# Patient Record
Sex: Male | Born: 1955 | Race: White | Hispanic: No | Marital: Single | Smoking: Former smoker
Health system: Southern US, Community
[De-identification: ages and names within clinical notes are randomized; demographics above are authoritative.]

## PROBLEM LIST (undated history)

## (undated) DIAGNOSIS — IMO0002 Reserved for concepts with insufficient information to code with codable children: Secondary | ICD-10-CM

## (undated) DIAGNOSIS — J449 Chronic obstructive pulmonary disease, unspecified: Secondary | ICD-10-CM

## (undated) DIAGNOSIS — R319 Hematuria, unspecified: Secondary | ICD-10-CM

## (undated) HISTORY — PX: ABDOMINAL SURGERY: SHX537

## (undated) HISTORY — DX: Hematuria, unspecified: R31.9

## (undated) HISTORY — PX: INNER EAR SURGERY: SHX679

## (undated) HISTORY — PX: OTHER SURGICAL HISTORY: SHX169

---

## 2005-04-13 ENCOUNTER — Other Ambulatory Visit: Payer: Self-pay

## 2005-04-13 ENCOUNTER — Emergency Department: Payer: Self-pay | Admitting: Emergency Medicine

## 2006-05-19 ENCOUNTER — Ambulatory Visit: Payer: Self-pay | Admitting: Sports Medicine

## 2006-05-19 ENCOUNTER — Inpatient Hospital Stay (HOSPITAL_COMMUNITY): Admission: EM | Admit: 2006-05-19 | Discharge: 2006-05-23 | Payer: Self-pay | Admitting: Emergency Medicine

## 2007-10-23 ENCOUNTER — Inpatient Hospital Stay (HOSPITAL_COMMUNITY): Admission: EM | Admit: 2007-10-23 | Discharge: 2007-10-25 | Payer: Self-pay | Admitting: Emergency Medicine

## 2010-09-29 ENCOUNTER — Inpatient Hospital Stay: Payer: Self-pay | Admitting: Internal Medicine

## 2010-12-19 NOTE — H&P (Signed)
NAMEFadi, [name]                  ACCOUNT NO.:  [id number]   MEDICAL RECORD NO.:  [id number]          PATIENT TYPE:  EMS   LOCATION:  MAJO                         FACILITY:  [facility]   PHYSICIAN:  Lucita Ferrara, MD         DATE OF BIRTH:  06/09/1956   DATE OF ADMISSION:  10/22/2007  DATE OF DISCHARGE:                              HISTORY & PHYSICAL   CHIEF COMPLAINT:  I vomited blood.   HISTORY OF PRESENT ILLNESS:  The patient is a 55 year old Caucasian male  presents to [facility] with vomiting coffee ground emesis  times one.  Symptoms began 3 days ago.  This was also accompanied by a  black stools that been ongoing.  In addition to this, patient has been  feeling dizzy.  This is accompanied by abdominal epigastric tenderness,  yet his abdomen is not distended.  The patient also had a massive  headache earlier in the day which caused him to take two ibuprofen back-  to-back which made his coffee-ground emesis and his bloody bowel  movements worse. He denies any massive hemoptysis or hematochezia or  bright red blood per rectum.  He also denies chest pain, shortness of  breath, orthopnea, paroxysmal nocturnal dyspnea.  He states that he does  get dizzy upon ambulation or upon standing up.  The patient is a known  alcoholic, last drink was earlier in the day.  The patient underwent a  prior EGD in May 23, 2006, which showed a normal-appearing  esophagus, but diffuse hemorrhagic gastritis with large amount of blood  in the stomach.  High-dose PPIs were recommended and avoidance of all  nonsteroidal anti-inflammatory medicines.   PAST MEDICAL HISTORY:  1. Peptic ulcer disease about 11 years ago.  2. Heavy alcohol intake.   FAMILY HISTORY:  Mother is living age 52 with coronary artery disease,  gallstones and also alcoholism.  Father died of leukemia at 30.  Brother  is living with diabetes.   ALLERGIES:  NO KNOWN DRUG ALLERGIES.   MEDICATIONS:  The patient denies  any other medications but p.r.n.  ibuprofen.   ALLERGIES:  NO KNOWN DRUG ALLERGIES   PHYSICAL EXAMINATION:  VITAL SIGNS:  Blood pressure is 121/76 pulse 94,  respirations 19, pulse ox 94% on room air.  The patient looks  uncomfortable, anxious.  HEENT: Mucous membranes are dry.  Head is  normocephalic, atraumatic.  Sclerae anicteric.  NECK:  Supple.  No JVD, no carotid bruits.  PERLA.  Extraocular muscles  intact.  CARDIOVASCULAR:  S1-S2.  Regular rate and rhythm.  No murmurs, rubs or  clicks.  ABDOMEN: Soft but there is epigastric tenderness to light palpation.  The abdomen is nondistended.  There is no hepatosplenomegaly.  EXTREMITIES: There is no clubbing, cyanosis or edema.  LUNGS:  Clear to auscultation bilaterally.  No rhonchi, rales or  wheezes.  NEUROLOGICAL:  Patient is alert and oriented x3.   LABORATORY DATA:  Blood alcohol level 353, amylase 99, sodium 136,  potassium 3.5, chloride 102, CO2 25, BUN 6, creatinine 0.58, AST/ALT 40  and 24, total bili 0.5, hemoglobin 14.5, hematocrit 41.8, platelets 200.   ASSESSMENT/PLAN:  The patient is a 55 year old with:  1. Coffee-ground emesis and dark stools with a normal hemoglobin. Note      that the patient may most likely have NSAID induced peptic ulcer      disease. r/o verecial bleed.  Will go ahead and admit the patient      to telemetry floor, monitor hemodynamic status and blood pressure      hemoglobin/hematocrit every 8 hours, type and cross 2 units and      hold, hydrate for now.  Check lipase.  May consider GI consultation      for upper and lower endoscopy will start Protonix 40 mg every 12      hours.  Consider Carafate.  2. Alcohol abuse/intoxication.  Thiamine, folate, multivitamin.      Ativan withdrawal protocol will be initiated.  Fall precautions.      Lucita Ferrara, MD  Electronically Signed     RR/MEDQ  D:  10/23/2007  T:  10/23/2007  Job:  [id number]

## 2010-12-19 NOTE — Consult Note (Signed)
NAMEDemetrias, [name]                  ACCOUNT NO.:  [id number]   MEDICAL RECORD NO.:  [id number]          PATIENT TYPE:  INP   LOCATION:  5525                         FACILITY:  [facility]   PHYSICIAN:  Jordan Hawks. Elnoria Howard, MD    DATE OF BIRTH:  1955-08-28   DATE OF CONSULTATION:  10/23/2007  DATE OF DISCHARGE:                                 CONSULTATION   REASON FOR CONSULTATION:  Hematemesis.   HISTORY OF PRESENT ILLNESS:  This is a 55 year old gentleman with a past  medical history of alcohol abuse, peptic ulcer disease status post  antrectomy for bleeding blood vessel, and hemorrhagic gastritis in  October 2007 who is admitted to the hospital with complaints of weakness  and coffee-ground emesis.  During this interview, the patient is  providing an inconsistent history with the one that was initially  obtained by [facility] hospitalists.  Apparently, at this time he states  having some hematemesis that followed a bout of nausea and vomiting.  Although, he states that he had hematochezia that preceded his  hematemesis.  He does give a history also of having melena.  The patient  does have some abdominal pain that is intermittent, and he does report  using ibuprofen.  At the time of admission, the patient was noted to be  markedly intoxicated and his hemoglobin value was 14.  Over the course  of his hospitalization, his hemoglobin has remained stable, and at this  time he reports having one episode of vomiting which was read.  However,  he just ate strawberry Jello   PAST MEDICAL AND SURGICAL HISTORY:  As stated above.   FAMILY HISTORY:  Noncontributory.   SOCIAL HISTORY:  Positive for alcohol abuse.   ALLERGIES:  NO KNOWN DRUG ALLERGIES.   HOME MEDICATIONS:  Ibuprofen p.r.n.   REVIEW OF SYSTEMS:  Unable to accurately obtain from the patient at this  time.   PHYSICAL EXAMINATION:  VITAL SIGNS:  Blood pressure is 127/82, heart  rate is 100, temperature is 98.2.  GENERAL:  The patient  is in no acute distress, alert and oriented.  HEENT:  Normocephalic, atraumatic.  Extraocular muscles intact.  Neck is  supple.  No lymphadenopathy.  LUNGS:  Clear to auscultation bilaterally.  CARDIOVASCULAR:  Regular rate and rhythm.  ABDOMEN:  Flat, soft, nontender, nondistended.  There is a well-healed  midline incisional scar.  Positive bowel sounds.  EXTREMITIES:  No clubbing, cyanosis or edema.  RECTAL:  Examination is negative for any palpable masses, and there is  brown stool which is heme-negative.   LABORATORY VALUES:  The current hemoglobin is 13.6 on admission.  White  blood cell count 8.2, hemoglobin 14.5, MCV is 101.7, platelets at 200.  PT is 11.3, INR 0.8, PTT is 25.  Sodium 139, potassium 3.5, chloride  103, CO2 25, glucose 113, BUN 4, creatinine 0.5, total bilirubin 0.6,  alk phos 45, AST 45, ALT 27, albumin 3.4.  Serum alcohol level on  admission is 353.   IMPRESSION:  1. Alcohol abuse.  2. Possible Mallory Weiss tear.  3. Heme negative.  4. History of peptic ulcer disease.  5. History of hemorrhagic gastritis.   Given the current findings and my examination of the patient,  specifically the rectal examination, I doubt that he has a significant  GI bleed.  Hemoglobin has remained stable and there is no decline at  this time.  The patient was very inconsistent with his history in  regards to what he saw, for example hematochezia versus melena, and also  hemoptysis versus coffee-ground emesis.  He certainly had a significant  GI bleed in 2007, but at this time, I do not believe that is the case.   PLAN:  1. Follow his hemoglobin.  2. If there is a marked drop in his hemoglobin upon the next check,      then an EGD can be performed.  3. I have advised the patient to stop drinking alcohol.      Jordan Hawks Elnoria Howard, MD  Electronically Signed     PDH/MEDQ  D:  10/23/2007  T:  10/23/2007  Job:  [id number]

## 2010-12-22 NOTE — H&P (Signed)
NAMECopeland, [name]                  ACCOUNT NO.:  [id number]   MEDICAL RECORD NO.:  [id number]          PATIENT TYPE:  INP   LOCATION:  1828                         FACILITY:  [facility]   PHYSICIAN:  Ruthe Mannan, M.D.       DATE OF BIRTH:  1956/03/15   DATE OF ADMISSION:  05/19/2006  DATE OF DISCHARGE:                                HISTORY & PHYSICAL   CHIEF COMPLAINT:  Vomiting blood and dark stools.   HISTORY OF PRESENT ILLNESS:  [name] is a 55 year old male with a history  of GI bleed and alcoholism who presents to the emergency department for one  day history of spitting up blood and dark stools.  He reports that these  symptoms started this morning along with left lower quadrant crampy  abdominal pain.  The pain is not relieved or aggravated by anything.  He  also reports his stools were loose and dark since this morning.  He also  endorsed dyspnea and decreased appetite.  He denies fever and weight loss.   PAST MEDICAL HISTORY:  Peptic ulcer disease that required surgery 10 years  ago.   FAMILY HISTORY:  Mother is living .  she is 52 years old with CAD,  gallstones, alcoholism with a history of GI bleed, hypertension, and  hypothyroidism.  Father died of leukemia at 73 years of age.  Brother living  with diabetes.   ALLERGIES:  No known drug allergies.   MEDICATIONS:  None.   SOCIAL HISTORY:  [place]  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  He also drinks 1-  1/2 pints of vodka per week and 6-12 beers per day.  He denies any drug use.   REVIEW OF SYSTEMS:  GENERAL:  No fevers, no weight loss.  CVS:  Positive  racing heartbeat.  Positive dizziness.  RESPIRATORY:  No shortness of  breath.  GI:  Positive for melena, positive for blood-tinged sputum.   PHYSICAL EXAMINATION:  VITAL SIGNS:  Temperature 98.3, respiratory rate 18,  heart rate 136, blood pressure 113/78.  Oxygen saturation 98% on room air.  GENERAL:  He is alert, in no acute distress.  RESPIRATORY:  Clear to auscultation bilaterally.  CARDIOVASCULAR:  Regular rate and rhythm but is tachycardic.  ABDOMEN:  Soft, tender to palpation over left lower quadrant with positive  bowel sounds.  EXTREMITIES:  No edema.  He does have a scaly and scabbing 3 x 1 cm lesion  over his left shin.  He also has 2+ pulses bilaterally.  RECTAL:  Some stool in the vault.  No visible hemorrhoids or fissures.   LABORATORY DATA:  White blood cell count 12.9, hematocrit 12.2, hemoglobin  38.3, platelets 243 with 70% neutrophils and 16% lymphocytes.  Sodium 137,  potassium 4.0, chloride 101, CO2 28, BUN 26, creatinine 0.6, glucose 122,  AST 55, ALT 54.  He is still hemoccult positive in the emergency department.   ASSESSMENT/PLAN:  1. GI bleed:  Likely upper GI bleed due to past medical  history of upper      GI bleed and history of alcoholism, along with current symptoms and      hemoccult positive dark stools.  Likely not due to esophageal varices      since the patient is hemodynamically stable with a hemoglobin of 13.2.      Will consider peptic ulcer disease versus gastritis.  Will check KUB to      rule out other sources of bleeding and abdominal pain.  Will also check      lipase and amylase to rule out pancreatitis due to history of      alcoholism.  The patient is also tachycardic, likely due to volume      depletion.  We will check orthostatics and will start IV fluids of D5      half normal saline with 20 mEq of K at 125 mL per hour.  We will also      keep the patient n.p.o. for now since he may eventually require surgery      or an NG tube.  2. Leukocytosis:  Since the patient is afebrile with no obvious source of      infection, leukocytosis may likely be due to dehydration.  We will      rehydrate with IV fluids overnight and recheck a CBC in the a.m.      Consider CBC, UA, urine culture, blood culture if white blood cells      still  increased.  3. Tachycardia:  Likely secondary to dehydration.  Will check EKG now.      Will also check BMP to look for any electrolyte abnormalities.  He also      has a family history of CD.  We will recheck EKG and fasting lipid      panel in a.m.  4. Alcoholism:  Will check alcohol level and urine drug screen now.  We      will also start Ativan protocol since the patient reports last alcohol      usage was yesterday morning.  5. Tobacco abuse:  We will give a nicotine transdermal patch 21 mg daily.      We will consider tobacco cessation counseling during hospitalization.  6. Prophylaxis:  Protonix 40 mg IV b.i.d., and we will encourage      ambulation.           ______________________________  Ruthe Mannan, M.D.     TA/MEDQ  D:  05/20/2006  T:  05/20/2006  Job:  [id number]

## 2010-12-22 NOTE — Op Note (Signed)
NAMEAmiere, [name]                  ACCOUNT NO.:  [id number]   MEDICAL RECORD NO.:  [id number]          PATIENT TYPE:  INP   LOCATION:  5703                         FACILITY:  [facility]   PHYSICIAN:  Anselmo Rod, M.D.  DATE OF BIRTH:  1956/07/26   DATE OF PROCEDURE:  05/20/2006  DATE OF DISCHARGE:                                 OPERATIVE REPORT   PROCEDURE PERFORMED:  Esophagogastroduodenoscopy.   ENDOSCOPIST:  Anselmo Rod, M.D.   INSTRUMENT USED:  Olympus video panendoscope.   INDICATION FOR PROCEDURE:  A 55 year old white male with a history of  hematemesis, undergoing EGD to rule out peptic ulcer disease, esophagitis,  gastritis, etc.  The patient has a longstanding history of alcohol abuse,  question varices.   PREPROCEDURE PREPARATION:  Informed consent was procured from the patient.  The patient was fasted for 8 hours prior to the procedure.  The risks and  benefits of the procedure were discussed with him in great detail.   PREPROCEDURE PHYSICAL:  VITAL SIGNS:  The patient had stable vital signs.  NECK:  Supple.  CHEST:  Clear to auscultation.  S1, S2 regular.  ABDOMEN:  Soft with normal bowel sounds.   DESCRIPTION OF PROCEDURE:  The patient was placed in the left lateral  decubitus position and sedated with 70 mcg of fentanyl and 8 mg of Versed in  slow incremental doses.  Once the patient was adequately sedate and  maintained on low-flow oxygen and continuous cardiac monitoring, the Olympus  video panendoscope was advanced through the mouthpiece, over the tongue,  into the esophagus under direct vision.  The entire esophagus was widely  patent with no evidence of ring, stricture, masses, esophagitis or Barrett's  mucosa.  There was no evidence of varices.  The scope was then advanced into  the stomach.  Diffuse hemorrhagic gastritis was noted.  No frank ulcer,  erosion, masses, or polyp was identified.  The patient was very combative  during the procedure, and  therefore complete visualization of the proximal  small bowel was not possible.  In spite of trying to hold the patient down  to complete the procedure, the procedure had to be aborted.  No biopsies  were done.  A small hiatal hernia was seen on high retroflexion.  The  patient had evidence of a partial gastrectomy, old blood was noted in the  stomach, and a fresh blood clot was seen in the proximal small bowel.   IMPRESSION:  1. Normal-appearing esophagus.  2. Diffuse hemorrhagic gastritis with a large amount of blood in the      stomach.  3. The patient is status post distal antrectomy from ulcer disease in the      past.  4. Old and fresh blood seen in the stomach and proximal small bowel,      respectively.   RECOMMENDATIONS:  1. Will continue high-dose PPIs.  2. Avoid all nonsteroidals for now.  3. Repeat EGD under general anesthesia if the patient shows continued sign      of blood loss.      Jyothi  Elsie Amis, M.D.  Electronically Signed     JNM/MEDQ  D:  05/21/2006  T:  05/23/2006  Job:  [id number]   cc:   Melina Fiddler, MD

## 2010-12-22 NOTE — Discharge Summary (Signed)
NAMEFadil, Macmaster Nichlos                  ACCOUNT NO.:  [id number]   MEDICAL RECORD NO.:  [id number]          PATIENT TYPE:  INP   LOCATION:  5703                         FACILITY:  [facility]   PHYSICIAN:  Melina Fiddler, MD DATE OF BIRTH:  05-20-1956   DATE OF ADMISSION:  05/19/2006  DATE OF DISCHARGE:  05/23/2006                                 DISCHARGE SUMMARY   ADDENDUM   CONSULTS:  Dr. Zenaida Niece with [facility] was consulted and  followed the patient throughout his hospital stay and also performed his  upper endoscopy.     ______________________________  Drue Dun, M.D.    ______________________________  Melina Fiddler, MD    EE/MEDQ  D:  05/23/2006  T:  05/24/2006  Job:  [id number]   cc:   Dr. Zenaida Niece

## 2010-12-22 NOTE — Discharge Summary (Signed)
[name], [name]                  ACCOUNT NO.:  [id number]   MEDICAL RECORD NO.:  [id number]          PATIENT TYPE:  INP   LOCATION:  5703                         FACILITY:  [facility]   PHYSICIAN:  Melina Fiddler, MD DATE OF BIRTH:  Oct 13, 1955   DATE OF ADMISSION:  05/19/2006  DATE OF DISCHARGE:  05/23/2006                                 DISCHARGE SUMMARY   ADMISSION DIAGNOSES:  1. Hematemesis.  2. Melanotic stools.  3. Alcohol abuse, with last drink being on May 18, 2006.  4. History of peptic ulcer disease with gastrointestinal bleed requiring      surgery approximately 10 years ago.   DISCHARGE DIAGNOSES:  1. Upper gastrointestinal bleed secondary to hemorrhagic gastritis.  2. Alcohol abuse.  3. History of peptic ulcer disease with gastrointestinal bleed requiring      surgery approximately 10 years ago.   BRIEF HOSPITAL COURSE:  Please see full dictated history and physical for  more details.  In brief, this is a 55 year old white male who presented to  the emergency department with a 1-day history of hematemesis and melanotic  stools, and a known history of alcohol abuse.  The patient was admitted, and  GI was consulted for upper endoscopy.  Upper endoscopy was difficult to  perform due to patient's difficulty being sedated; however, the esophagus  was visualized as normal, and hemorrhagic gastritis was visualized on upper  endoscopy.  The patient was placed on high-dose proton pump inhibitor with  b.i.d. dosing.  Hemoglobins were checked serially to ensure no further  bleeding.  The patient was also placed on Ativan protocol for alcohol  withdrawal.  He was also given a multivitamin and thiamine given his alcohol  abuse, as well as a nicotine patch.  Gastroenterology agreed that if  patient's hemoglobin and hematocrit did not remain stable, they would take  him to the operating room for repeat upper endoscopy under general  anesthesia; however, this was not  necessary, as the patient's hemoglobin did  remain stable.  The patient will be discharged on high-dose proton pump  inhibitor and instructed to avoid NSAIDs and alcohol.   PROCEDURES:  1. EKG on May 19, 2006, and May 20, 2006, both showed no signs of      acute ischemia and were positive only for sinus tachycardia.  2. Upper endoscopy on May 20, 2006, performed by Dr. Loreta Ave, revealed      normal-appearing esophagus, but diffuse hemorrhagic gastritis with a      large amount of blood in the stomach.  The patient is status post      distal antrectomy from ulcer disease in the past.  Old and fresh blood      was visualized in the stomach and proximal bowel, respectively.   PERTINENT LABORATORY DATA:  On admission, the patient had a hemoglobin of  13.2 and hematocrit of 38.3.  These were likely somewhat hemoconcentrated  given patient's dehydration and tachycardia.  The patient's hemoglobin and  hematocrit dropped to a nadir of 9.0 and 25.3 during admission and remained  fairly stable, being 9.1  and 26.1 at the time of discharge.  The patient had  cardiac enzymes, which were negative x3.  The patient had a fasting lipid  panel, which showed a total cholesterol of 130, triglycerides of 86, HDL of  53, and LDL of 60.  Iron studies revealed a folate of 955, reticulocyte  count of 1.5%, and vitamin B12 of 472.   DISCHARGE MEDICATIONS:  1. Omeprazole 40 mg b.i.d., prescription given for 1 month's supply.  2. Multivitamin 1 tab p.o. daily.   DISCHARGE INSTRUCTIONS:  The patient is to follow a regular diet with no  activity restrictions.  The patient was instructed not to drink any alcohol  and not to take any products containing ASPIRIN, IBUPROFEN, NAPROXEN or  other NSAIDS, including GOODY'S and BC POWDER.  The patient was also  instructed to return if his bleeding recurs.   PENDING RESULTS AND ISSUES TO BE FOLLOWED AT THE TIME OF DISCHARGE:  1. The patient will need to continue  high-dose proton pump inhibitor with      b.i.d. dosing for 1 month after discharge at the recommendation of the      gastroenterologist.  After this time, patient may decrease to once-      daily dosing.  2. Would recommend a repeat hemoglobin and hematocrit as an outpatient to      ensure continued stabilization.   The patient was discharged home in stable condition.   FOLLOWUP APPOINTMENTS:  The patient has a followup appointment with Dr.  Mayford Knife at [facility] in the Hospital Followup Clinic on  May 30, 2006, at 9:00 in the morning.     ______________________________  Drue Dun, M.D.    ______________________________  Melina Fiddler, MD    EE/MEDQ  D:  05/23/2006  T:  05/24/2006  Job:  [id number]

## 2011-01-31 ENCOUNTER — Emergency Department: Payer: Self-pay | Admitting: Unknown Physician Specialty

## 2011-02-01 ENCOUNTER — Emergency Department (HOSPITAL_COMMUNITY)
Admission: EM | Admit: 2011-02-01 | Discharge: 2011-02-01 | Disposition: A | Discharge status: Home / Self Care | Payer: Self-pay | Attending: Emergency Medicine | Admitting: Emergency Medicine

## 2011-02-01 DIAGNOSIS — Z23 Encounter for immunization: Secondary | ICD-10-CM | POA: Insufficient documentation

## 2011-02-01 DIAGNOSIS — Z203 Contact with and (suspected) exposure to rabies: Secondary | ICD-10-CM | POA: Insufficient documentation

## 2011-02-01 DIAGNOSIS — F17200 Nicotine dependence, unspecified, uncomplicated: Secondary | ICD-10-CM | POA: Insufficient documentation

## 2011-02-03 ENCOUNTER — Emergency Department (HOSPITAL_COMMUNITY)
Admission: EM | Admit: 2011-02-03 | Discharge: 2011-02-03 | Disposition: A | Discharge status: Home / Self Care | Payer: Self-pay | Attending: Emergency Medicine | Admitting: Emergency Medicine

## 2011-02-03 DIAGNOSIS — L509 Urticaria, unspecified: Secondary | ICD-10-CM | POA: Insufficient documentation

## 2011-02-03 DIAGNOSIS — R110 Nausea: Secondary | ICD-10-CM | POA: Insufficient documentation

## 2011-02-03 DIAGNOSIS — R21 Rash and other nonspecific skin eruption: Secondary | ICD-10-CM | POA: Insufficient documentation

## 2011-02-03 DIAGNOSIS — R112 Nausea with vomiting, unspecified: Secondary | ICD-10-CM | POA: Insufficient documentation

## 2011-02-03 DIAGNOSIS — L2989 Other pruritus: Secondary | ICD-10-CM | POA: Insufficient documentation

## 2011-02-03 DIAGNOSIS — T50B95A Adverse effect of other viral vaccines, initial encounter: Secondary | ICD-10-CM | POA: Insufficient documentation

## 2011-02-03 DIAGNOSIS — L298 Other pruritus: Secondary | ICD-10-CM | POA: Insufficient documentation

## 2011-02-04 ENCOUNTER — Inpatient Hospital Stay (INDEPENDENT_AMBULATORY_CARE_PROVIDER_SITE_OTHER)
Admission: RE | Admit: 2011-02-04 | Discharge: 2011-02-04 | Disposition: A | Discharge status: Home / Self Care | Payer: Self-pay | Source: Ambulatory Visit | Attending: Emergency Medicine | Admitting: Emergency Medicine

## 2011-02-04 DIAGNOSIS — Z23 Encounter for immunization: Secondary | ICD-10-CM

## 2011-02-08 ENCOUNTER — Inpatient Hospital Stay (INDEPENDENT_AMBULATORY_CARE_PROVIDER_SITE_OTHER)
Admission: RE | Admit: 2011-02-08 | Discharge: 2011-02-08 | Disposition: A | Discharge status: Home / Self Care | Payer: Self-pay | Source: Ambulatory Visit | Attending: Family Medicine | Admitting: Family Medicine

## 2011-02-08 DIAGNOSIS — Z23 Encounter for immunization: Secondary | ICD-10-CM

## 2011-02-15 ENCOUNTER — Inpatient Hospital Stay (INDEPENDENT_AMBULATORY_CARE_PROVIDER_SITE_OTHER)
Admission: RE | Admit: 2011-02-15 | Discharge: 2011-02-15 | Disposition: A | Discharge status: Home / Self Care | Payer: Self-pay | Source: Ambulatory Visit | Attending: Family Medicine | Admitting: Family Medicine

## 2011-02-15 DIAGNOSIS — Z23 Encounter for immunization: Secondary | ICD-10-CM

## 2011-03-28 ENCOUNTER — Emergency Department: Payer: Self-pay | Admitting: *Deleted

## 2011-03-28 ENCOUNTER — Emergency Department (HOSPITAL_COMMUNITY)
Admission: EM | Admit: 2011-03-28 | Discharge: 2011-03-28 | Discharge status: Against Medical Advice | Payer: Self-pay | Attending: Emergency Medicine | Admitting: Emergency Medicine

## 2011-03-28 DIAGNOSIS — R079 Chest pain, unspecified: Secondary | ICD-10-CM | POA: Insufficient documentation

## 2011-04-30 LAB — CROSSMATCH: ABO/RH(D): O POS

## 2011-04-30 LAB — CBC
HCT: 41.8
HCT: 41.8
Hemoglobin: 14.3
MCHC: 34.6
MCHC: 34.7
MCV: 102.2 — ABNORMAL HIGH
MCV: 102.5 — ABNORMAL HIGH
Platelets: 200
RBC: 4.04 — ABNORMAL LOW
RBC: 4.08 — ABNORMAL LOW
RDW: 13.4

## 2011-04-30 LAB — SAMPLE TO BLOOD BANK

## 2011-04-30 LAB — COMPREHENSIVE METABOLIC PANEL
AST: 40 — ABNORMAL HIGH
AST: 60 — ABNORMAL HIGH
Albumin: 3.6
Alkaline Phosphatase: 45
Alkaline Phosphatase: 52
BUN: 4 — ABNORMAL LOW
BUN: 4 — ABNORMAL LOW
BUN: 6
CO2: 24
Calcium: 8.9
Chloride: 103
Creatinine, Ser: 0.54
GFR calc Af Amer: >60
GFR calc Af Amer: >60
GFR calc non Af Amer: >60
Glucose, Bld: 113 — ABNORMAL HIGH
Potassium: 3.5
Potassium: 3.5
Sodium: 136
Total Bilirubin: 0.6
Total Protein: 6.6

## 2011-04-30 LAB — HEMOGLOBIN AND HEMATOCRIT, BLOOD: Hemoglobin: 13.6

## 2011-04-30 LAB — BASIC METABOLIC PANEL
CO2: 25
Calcium: 8.9
Chloride: 105
GFR calc Af Amer: >60
Sodium: 139

## 2011-04-30 LAB — MAGNESIUM: Magnesium: 1.8

## 2011-04-30 LAB — PHOSPHORUS: Phosphorus: 3.6

## 2011-04-30 LAB — PROTIME-INR: INR: 0.8

## 2011-04-30 LAB — ETHANOL: Alcohol, Ethyl (B): 353 — ABNORMAL HIGH

## 2011-04-30 LAB — AMYLASE: Amylase: 99

## 2015-02-23 ENCOUNTER — Emergency Department
Admission: EM | Admit: 2015-02-23 | Discharge: 2015-02-23 | Disposition: A | Discharge status: Home / Self Care | Payer: Self-pay | Attending: Emergency Medicine | Admitting: Emergency Medicine

## 2015-02-23 ENCOUNTER — Encounter: Payer: Self-pay | Admitting: Urgent Care

## 2015-02-23 DIAGNOSIS — Z720 Tobacco use: Secondary | ICD-10-CM | POA: Insufficient documentation

## 2015-02-23 DIAGNOSIS — L509 Urticaria, unspecified: Secondary | ICD-10-CM | POA: Insufficient documentation

## 2015-02-23 MED ORDER — DIPHENHYDRAMINE HCL 50 MG PO CAPS
50.0000 mg | ORAL_CAPSULE | Freq: Once | ORAL | Status: AC
  Administered 2015-02-23: 50 mg via ORAL
  Filled 2015-02-23: qty 1

## 2015-02-23 MED ORDER — PREDNISONE 20 MG PO TABS
60.0000 mg | ORAL_TABLET | Freq: Once | ORAL | Status: AC
  Administered 2015-02-23: 60 mg via ORAL
  Filled 2015-02-23: qty 3

## 2015-02-23 NOTE — Discharge Instructions (Signed)
Hives Hives are itchy, red, swollen areas of the skin. They can vary in size and location on your body. Hives can come and go for hours or several days (acute hives) or for several weeks (chronic hives). Hives do not spread from person to person (noncontagious). They may get worse with scratching, exercise, and emotional stress. CAUSES   Allergic reaction to food, additives, or drugs.  Infections, including the common cold.  Illness, such as vasculitis, lupus, or thyroid disease.  Exposure to sunlight, heat, or cold.  Exercise.  Stress.  Contact with chemicals. SYMPTOMS   Red or white swollen patches on the skin. The patches may change size, shape, and location quickly and repeatedly.  Itching.  Swelling of the hands, feet, and face. This may occur if hives develop deeper in the skin. DIAGNOSIS  Your caregiver can usually tell what is wrong by performing a physical exam. Skin or blood tests may also be done to determine the cause of your hives. In some cases, the cause cannot be determined. TREATMENT  Mild cases usually get better with medicines such as antihistamines. Severe cases may require an emergency epinephrine injection. If the cause of your hives is known, treatment includes avoiding that trigger.  HOME CARE INSTRUCTIONS   Avoid causes that trigger your hives.  Take antihistamines as directed by your caregiver to reduce the severity of your hives. Non-sedating or low-sedating antihistamines are usually recommended. Do not drive while taking an antihistamine.  Take any other medicines prescribed for itching as directed by your caregiver.  Wear loose-fitting clothing.  Keep all follow-up appointments as directed by your caregiver. SEEK MEDICAL CARE IF:   You have persistent or severe itching that is not relieved with medicine.  You have painful or swollen joints. SEEK IMMEDIATE MEDICAL CARE IF:   You have a fever.  Your tongue or lips are swollen.  You have  trouble breathing or swallowing.  You feel tightness in the throat or chest.  You have abdominal pain. These problems may be the first sign of a life-threatening allergic reaction. Call your local emergency services (911 in [place] MAKE SURE YOU:   Understand these instructions.  Will watch your condition.  Will get help right away if you are not doing well or get worse. Document Released: 07/23/2005 Document Revised: 07/28/2013 Document Reviewed: 10/16/2011 [employer] Patient Information 2015 [employer]. This information is not intended to replace advice given to you by your health care provider. Make sure you discuss any questions you have with your health care provider.  

## 2015-02-23 NOTE — ED Notes (Signed)
Patient presents with rash to entire body; (+) itching. Symptoms of unknown etiology; denies new foods, soaps, detergents, medications, etc. 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 Denies SOB. No increased WOB or respiratory involvement noted in triage.

## 2015-02-23 NOTE — ED Notes (Signed)
Brown,MD consulted. MD made aware of presenting complaints and triage assessment. MD with order for Benadryl  PO and Prednisone  PO. Orders to be entered by MD and carried by this RN.

## 2015-02-23 NOTE — ED Provider Notes (Signed)
[facility] Emergency Department Provider Note  ____________________________________________  Time seen: 1:50 AM  I have reviewed the triage vital signs and the nursing notes.   HISTORY  Chief Complaint Allergic Reaction     HPI [name] is a 59 y.o. male presents with generalized pruritic hives onset prior to presentation to emergency department. Patient denies any dyspnea no difficulty swallowing no nausea or vomiting at this time. Patient admits to previous episode of same approximately 5 years ago. Patient denies any recent medication usage or change in diet.    Past medical history  There are no active problems to display for this patient.   No past surgical history on file.  No current outpatient prescriptions on file.  Allergies No known drug allergies No family history on file.  Social History History  Substance Use Topics  . Smoking status: Current Every Day Smoker  . Smokeless tobacco: Not on file  . Alcohol Use: Yes    Review of Systems  Constitutional: Negative for fever. Eyes: Negative for visual changes. ENT: Negative for sore throat. Cardiovascular: Negative for chest pain. Respiratory: Negative for shortness of breath. Gastrointestinal: Negative for abdominal pain, vomiting and diarrhea. Genitourinary: Negative for dysuria. Musculoskeletal: Negative for back pain. Skin: Positive for rash. Neurological: Negative for headaches, focal weakness or numbness.   10-point ROS otherwise negative.  ____________________________________________   PHYSICAL EXAM:  VITAL SIGNS: ED Triage Vitals  Enc Vitals Group     BP 02/23/15 0021 114/64 mmHg     Pulse Rate 02/23/15 0021 116     Resp 02/23/15 0021 18     Temp 02/23/15 0021 97.6 F (36.4 C)     Temp Source 02/23/15 0021 Oral     SpO2 02/23/15 0021 92 %     Weight --      Height --      Head Cir --      Peak Flow --      Pain Score 02/23/15 0023 0     Pain Loc  --      Pain Edu? --      Excl. in GC? --      Constitutional: Alert and oriented. Well appearing and in no distress. Eyes: Conjunctivae are normal. PERRL. Normal extraocular movements. ENT   Head: Normocephalic and atraumatic.   Nose: No congestion/rhinnorhea.   Mouth/Throat: Mucous membranes are moist.   Neck: No stridor. Cardiovascular: Normal rate, regular rhythm. Normal and symmetric distal pulses are present in all extremities. No murmurs, rubs, or gallops. Respiratory: Normal respiratory effort without tachypnea nor retractions. Breath sounds are clear and equal bilaterally. No wheezes/rales/rhonchi. Gastrointestinal: Soft and nontender. No distention. There is no CVA tenderness. Genitourinary: deferred Musculoskeletal: Nontender with normal range of motion in all extremities. No joint effusions.  No lower extremity tenderness nor edema. Neurologic:  Normal speech and language. No gross focal neurologic deficits are appreciated. Speech is normal.  Skin:  Skin is warm, dry and intact. No rash noted. Psychiatric: Mood and affect are normal. Speech and behavior are normal. Patient exhibits appropriate insight and judgment.    INITIAL IMPRESSION / ASSESSMENT AND PLAN / ED COURSE  Pertinent labs & imaging results that were available during my care of the patient were reviewed by me and considered in my medical decision making (see chart for details).  Patient received Benadryl and prednisone with complete resolution of rash. Patient will be prescribed EpiPen and prednisone for home with recommendation to follow-up with Dr. Hannah Beatdangle allergies.  ____________________________________________   FINAL CLINICAL IMPRESSION(S) / ED DIAGNOSES  Final diagnoses:  Hives      Darci Current, MD 02/23/15 0230

## 2015-02-23 NOTE — ED Notes (Signed)
Pt uprite on stretcher in exam room with no distress noted; reports generalized itching/rash this evening with no known cause; denies diff breathing or swallowing & st feeling much better now after meds received in triage

## 2016-11-15 ENCOUNTER — Emergency Department: Payer: Self-pay

## 2016-11-15 ENCOUNTER — Encounter: Payer: Self-pay | Admitting: Emergency Medicine

## 2016-11-15 ENCOUNTER — Inpatient Hospital Stay
Admission: EM | Admit: 2016-11-15 | Discharge: 2016-11-17 | DRG: 871 | Disposition: A | Discharge status: Home / Self Care | Payer: Self-pay | Attending: Internal Medicine | Admitting: Internal Medicine

## 2016-11-15 DIAGNOSIS — E43 Unspecified severe protein-calorie malnutrition: Secondary | ICD-10-CM | POA: Diagnosis present

## 2016-11-15 DIAGNOSIS — J441 Chronic obstructive pulmonary disease with (acute) exacerbation: Secondary | ICD-10-CM | POA: Diagnosis present

## 2016-11-15 DIAGNOSIS — J189 Pneumonia, unspecified organism: Secondary | ICD-10-CM

## 2016-11-15 DIAGNOSIS — R319 Hematuria, unspecified: Secondary | ICD-10-CM

## 2016-11-15 DIAGNOSIS — F17200 Nicotine dependence, unspecified, uncomplicated: Secondary | ICD-10-CM | POA: Diagnosis present

## 2016-11-15 DIAGNOSIS — N200 Calculus of kidney: Secondary | ICD-10-CM | POA: Diagnosis present

## 2016-11-15 DIAGNOSIS — J9601 Acute respiratory failure with hypoxia: Secondary | ICD-10-CM | POA: Diagnosis present

## 2016-11-15 DIAGNOSIS — E871 Hypo-osmolality and hyponatremia: Secondary | ICD-10-CM | POA: Diagnosis present

## 2016-11-15 DIAGNOSIS — J101 Influenza due to other identified influenza virus with other respiratory manifestations: Secondary | ICD-10-CM | POA: Diagnosis present

## 2016-11-15 DIAGNOSIS — A419 Sepsis, unspecified organism: Principal | ICD-10-CM | POA: Diagnosis present

## 2016-11-15 DIAGNOSIS — Z681 Body mass index (BMI) 19 or less, adult: Secondary | ICD-10-CM

## 2016-11-15 HISTORY — DX: Reserved for concepts with insufficient information to code with codable children: IMO0002

## 2016-11-15 LAB — COMPREHENSIVE METABOLIC PANEL
ALT: 19 U/L (ref 17–63)
AST: 28 U/L (ref 15–41)
Albumin: 3.8 g/dL (ref 3.5–5.0)
Alkaline Phosphatase: 63 U/L (ref 38–126)
Anion gap: 10 (ref 5–15)
BUN: 15 mg/dL (ref 6–20)
CHLORIDE: 97 mmol/L — AB (ref 101–111)
CO2: 22 mmol/L (ref 22–32)
CREATININE: 0.69 mg/dL (ref 0.61–1.24)
Calcium: 8.8 mg/dL — ABNORMAL LOW (ref 8.9–10.3)
GFR calc Af Amer: >60 mL/min (ref >60)
Glucose, Bld: 103 mg/dL — ABNORMAL HIGH (ref 65–99)
Potassium: 3.9 mmol/L (ref 3.5–5.1)
SODIUM: 129 mmol/L — AB (ref 135–145)
Total Bilirubin: 0.6 mg/dL (ref 0.3–1.2)
Total Protein: 7.4 g/dL (ref 6.5–8.1)

## 2016-11-15 LAB — URINALYSIS, ROUTINE W REFLEX MICROSCOPIC
Bacteria, UA: NONE SEEN
Bilirubin Urine: NEGATIVE
GLUCOSE, UA: NEGATIVE mg/dL
Ketones, ur: 20 mg/dL — AB
Leukocytes, UA: NEGATIVE
Nitrite: NEGATIVE
PH: 5 (ref 5.0–8.0)
PROTEIN: 30 mg/dL — AB
Specific Gravity, Urine: 1.03 (ref 1.005–1.030)

## 2016-11-15 LAB — BLOOD GAS, VENOUS
PCO2 VEN: 37 mmHg — AB (ref 44.0–60.0)
PH VEN: 7.4 (ref 7.250–7.430)
Patient temperature: 37
pO2, Ven: 62 mmHg — ABNORMAL HIGH (ref 32.0–45.0)

## 2016-11-15 LAB — CBC WITH DIFFERENTIAL/PLATELET
Basophils Absolute: 0 10*3/uL (ref 0–0.1)
Basophils Relative: 0 %
EOS ABS: 0 10*3/uL (ref 0–0.7)
EOS PCT: 0 %
HCT: 44.1 % (ref 40.0–52.0)
Hemoglobin: 15.1 g/dL (ref 13.0–18.0)
LYMPHS ABS: 1 10*3/uL (ref 1.0–3.6)
Lymphocytes Relative: 8 %
MCH: 34.9 pg — AB (ref 26.0–34.0)
MCHC: 34.4 g/dL (ref 32.0–36.0)
MCV: 101.5 fL — ABNORMAL HIGH (ref 80.0–100.0)
MONOS PCT: 15 %
Monocytes Absolute: 1.7 10*3/uL — ABNORMAL HIGH (ref 0.2–1.0)
Neutro Abs: 8.9 10*3/uL — ABNORMAL HIGH (ref 1.4–6.5)
Neutrophils Relative %: 77 %
PLATELETS: 153 10*3/uL (ref 150–440)
RBC: 4.34 MIL/uL — ABNORMAL LOW (ref 4.40–5.90)
RDW: 13 % (ref 11.5–14.5)
WBC: 11.7 10*3/uL — ABNORMAL HIGH (ref 3.8–10.6)

## 2016-11-15 LAB — APTT: aPTT: 34 seconds (ref 24–36)

## 2016-11-15 LAB — LACTIC ACID, PLASMA: Lactic Acid, Venous: 0.8 mmol/L (ref 0.5–1.9)

## 2016-11-15 LAB — INFLUENZA PANEL BY PCR (TYPE A & B)
Influenza A By PCR: NEGATIVE
Influenza B By PCR: POSITIVE — AB

## 2016-11-15 LAB — PROTIME-INR
INR: 0.89
PROTHROMBIN TIME: 12 s (ref 11.4–15.2)

## 2016-11-15 LAB — PROCALCITONIN: Procalcitonin: 0.47 ng/mL

## 2016-11-15 LAB — TROPONIN I

## 2016-11-15 MED ORDER — DEXTROSE 5 % IV SOLN: 1.0000 g | Freq: Once | INTRAVENOUS | Status: DC

## 2016-11-15 MED ORDER — SODIUM CHLORIDE 0.9% FLUSH: 3.0000 mL | INTRAVENOUS | Status: DC | PRN

## 2016-11-15 MED ORDER — DEXTROSE 5 % IV SOLN
500.0000 mg | INTRAVENOUS | Status: DC
  Administered 2016-11-16: 500 mg via INTRAVENOUS
  Filled 2016-11-15 (×2): qty 500

## 2016-11-15 MED ORDER — ONDANSETRON HCL 4 MG/2ML IJ SOLN
4.0000 mg | Freq: Four times a day (QID) | INTRAMUSCULAR | Status: DC | PRN
  Administered 2016-11-15 – 2016-11-16 (×3): 4 mg via INTRAVENOUS
  Filled 2016-11-15 (×3): qty 2

## 2016-11-15 MED ORDER — DEXTROSE 5 % IV SOLN
1.0000 g | INTRAVENOUS | Status: DC
  Administered 2016-11-16: 10:00:00 1 g via INTRAVENOUS
  Filled 2016-11-15 (×3): qty 10

## 2016-11-15 MED ORDER — ENOXAPARIN SODIUM 40 MG/0.4ML ~~LOC~~ SOLN
40.0000 mg | SUBCUTANEOUS | Status: DC
  Administered 2016-11-15: 21:00:00 40 mg via SUBCUTANEOUS
  Filled 2016-11-15 (×2): qty 0.4

## 2016-11-15 MED ORDER — ACETAMINOPHEN 500 MG PO TABS
1000.0000 mg | ORAL_TABLET | Freq: Once | ORAL | Status: AC
  Administered 2016-11-15: 1000 mg via ORAL
  Filled 2016-11-15: qty 2

## 2016-11-15 MED ORDER — SODIUM CHLORIDE 0.9 % IV SOLN: 250.0000 mL | INTRAVENOUS | Status: DC | PRN

## 2016-11-15 MED ORDER — DEXTROSE 5 % IV SOLN
500.0000 mg | Freq: Once | INTRAVENOUS | Status: AC
  Administered 2016-11-15: 500 mg via INTRAVENOUS
  Filled 2016-11-15: qty 500

## 2016-11-15 MED ORDER — SODIUM CHLORIDE 0.9 % IV BOLUS (SEPSIS)
1000.0000 mL | Freq: Once | INTRAVENOUS | Status: AC
  Administered 2016-11-15: 1000 mL via INTRAVENOUS

## 2016-11-15 MED ORDER — OSELTAMIVIR PHOSPHATE 75 MG PO CAPS
75.0000 mg | ORAL_CAPSULE | Freq: Once | ORAL | Status: AC
  Administered 2016-11-15: 75 mg via ORAL
  Filled 2016-11-15: qty 1

## 2016-11-15 MED ORDER — GUAIFENESIN 100 MG/5ML PO SOLN
5.0000 mL | ORAL | Status: DC | PRN
  Administered 2016-11-15 – 2016-11-16 (×4): 100 mg via ORAL
  Filled 2016-11-15 (×4): qty 10

## 2016-11-15 MED ORDER — SENNOSIDES-DOCUSATE SODIUM 8.6-50 MG PO TABS: 1.0000 | ORAL_TABLET | Freq: Every evening | ORAL | Status: DC | PRN

## 2016-11-15 MED ORDER — SODIUM CHLORIDE 0.9% FLUSH
3.0000 mL | Freq: Two times a day (BID) | INTRAVENOUS | Status: DC
  Administered 2016-11-16 (×2): 3 mL via INTRAVENOUS

## 2016-11-15 MED ORDER — ACETAMINOPHEN 325 MG PO TABS: 650.0000 mg | ORAL_TABLET | Freq: Four times a day (QID) | ORAL | Status: DC | PRN

## 2016-11-15 MED ORDER — ZOLPIDEM TARTRATE 5 MG PO TABS
5.0000 mg | ORAL_TABLET | Freq: Every evening | ORAL | Status: DC | PRN
  Administered 2016-11-15 – 2016-11-16 (×2): 5 mg via ORAL
  Filled 2016-11-15 (×2): qty 1

## 2016-11-15 MED ORDER — HYDROCODONE-ACETAMINOPHEN 5-325 MG PO TABS: 1.0000 | ORAL_TABLET | ORAL | Status: DC | PRN

## 2016-11-15 MED ORDER — CEFTRIAXONE SODIUM-DEXTROSE 1-3.74 GM-% IV SOLR
1.0000 g | Freq: Once | INTRAVENOUS | Status: AC
  Administered 2016-11-15: 1 g via INTRAVENOUS
  Filled 2016-11-15: qty 50

## 2016-11-15 MED ORDER — NICOTINE 14 MG/24HR TD PT24
14.0000 mg | MEDICATED_PATCH | Freq: Every day | TRANSDERMAL | Status: DC
  Administered 2016-11-15 – 2016-11-17 (×3): 14 mg via TRANSDERMAL
  Filled 2016-11-15: qty 1
  Filled 2016-11-15: qty 2
  Filled 2016-11-15: qty 1

## 2016-11-15 MED ORDER — ACETAMINOPHEN 650 MG RE SUPP: 650.0000 mg | Freq: Four times a day (QID) | RECTAL | Status: DC | PRN

## 2016-11-15 MED ORDER — ALBUTEROL SULFATE (2.5 MG/3ML) 0.083% IN NEBU: 2.5000 mg | INHALATION_SOLUTION | RESPIRATORY_TRACT | Status: DC | PRN

## 2016-11-15 MED ORDER — SALINE SPRAY 0.65 % NA SOLN
1.0000 | NASAL | Status: DC | PRN
  Filled 2016-11-15: qty 44

## 2016-11-15 MED ORDER — SODIUM CHLORIDE 0.9 % IV SOLN
INTRAVENOUS | Status: DC
  Administered 2016-11-15 – 2016-11-16 (×2): via INTRAVENOUS

## 2016-11-15 MED ORDER — KETOROLAC TROMETHAMINE 30 MG/ML IJ SOLN
30.0000 mg | Freq: Four times a day (QID) | INTRAMUSCULAR | Status: DC | PRN
  Administered 2016-11-15 – 2016-11-16 (×3): 30 mg via INTRAVENOUS
  Filled 2016-11-15 (×3): qty 1

## 2016-11-15 MED ORDER — ONDANSETRON HCL 4 MG PO TABS: 4.0000 mg | ORAL_TABLET | Freq: Four times a day (QID) | ORAL | Status: DC | PRN

## 2016-11-15 MED ORDER — SODIUM CHLORIDE 0.9% FLUSH
3.0000 mL | Freq: Two times a day (BID) | INTRAVENOUS | Status: DC
  Administered 2016-11-15 – 2016-11-17 (×4): 3 mL via INTRAVENOUS

## 2016-11-15 NOTE — ED Notes (Signed)
Patient transported to X-ray 

## 2016-11-15 NOTE — H&P (Addendum)
[employer] - [place] [facility]   PATIENT NAME: [name]    MR#:  [id number]  DATE OF BIRTH:  [birth year]  DATE OF ADMISSION:  11/15/2016  PRIMARY CARE PHYSICIAN: No PCP Per Patient   REQUESTING/REFERRING PHYSICIAN: Rockne Menghini, MD  CHIEF COMPLAINT:   Chief Complaint  Patient presents with  . Shortness of Breath   Worsening cough and shortness breath for 4 days. HISTORY OF PRESENT ILLNESS:  [name]  is a 61 y.o. male with No past medical history presents in the ED with worsening cough and shortness breath for 4 days. Patient has had cough with yellowish productive sputum and shortness of breath for 4 days. He also complains of fever and chills, chest pain while coughing. Upon in the ED, he was found tachycardic, hypoxic and febrile. He is put on oxygen [place]. Chest x-ray show COPD and questionable pneumonia per ED physician. PAST MEDICAL HISTORY:  History reviewed. No pertinent past medical history.  PAST SURGICAL HISTORY:   Past Surgical History:  Procedure Laterality Date  . ABDOMINAL SURGERY     "burst blood vessel"  . stomach ulcer surgery      SOCIAL HISTORY:   Social History  Substance Use Topics  . Smoking status: Current Every Day Smoker  . Smokeless tobacco: Never Used  . Alcohol use Yes    FAMILY HISTORY:   Family History  Problem Relation Age of Onset  . Cancer Mother   . Leukemia Father   . Diabetes Brother     DRUG ALLERGIES:  No Known Allergies  REVIEW OF SYSTEMS:   Review of Systems  Constitutional: Positive for chills, fever and malaise/fatigue.  HENT: Positive for sore throat. Negative for congestion.   Eyes: Negative for blurred vision and double vision.  Respiratory: Positive for cough, sputum production, shortness of breath and wheezing. Negative for hemoptysis and stridor.   Cardiovascular: Positive for chest pain. Negative for palpitations, orthopnea and leg swelling.  Gastrointestinal: Negative for  abdominal pain, blood in stool, diarrhea, melena, nausea and vomiting.  Genitourinary: Negative for dysuria, flank pain and hematuria.  Musculoskeletal: Negative for back pain.  Skin: Negative for itching and rash.  Neurological: Positive for weakness. Negative for dizziness, focal weakness, loss of consciousness and headaches.  Psychiatric/Behavioral: Negative for depression. The patient is not nervous/anxious.     MEDICATIONS AT HOME:   Prior to Admission medications   Not on File      VITAL SIGNS:  Blood pressure 113/69, pulse 98, temperature 99.7 F (37.6 C), temperature source Oral, resp. rate (!) 23, height  (1.753 m), weight 120 lb (54.4 kg), SpO2 100 %.  PHYSICAL EXAMINATION:  Physical Exam  GENERAL:  61 y.o.-year-old patient lying in the bed with no acute distress.  EYES: Pupils equal, round, reactive to light and accommodation. No scleral icterus. Extraocular muscles intact.  HEENT: Head atraumatic, normocephalic. Oropharynx erythema. NECK:  Supple, no jugular venous distention. No thyroid enlargement, no tenderness.  LUNGS: Normal breath sounds bilaterally, mild  wheezing,  No rales,rhonchi or crepitation. No use of accessory muscles of respiration.  CARDIOVASCULAR: S1, S2 normal. No murmurs, rubs, or gallops.  ABDOMEN: Soft, nontender, nondistended. Bowel sounds present. No organomegaly or mass.  EXTREMITIES: No pedal edema, cyanosis, or clubbing.  NEUROLOGIC: Cranial nerves II through XII are intact. Muscle strength 5/5 in all extremities. Sensation intact. Gait not checked.  PSYCHIATRIC: The patient is alert and oriented x 3.  SKIN: No obvious rash, lesion, or ulcer.  LABORATORY PANEL:   CBC  Recent Labs Lab 11/15/16 1116  WBC 11.7*  HGB 15.1  HCT 44.1  PLT 153   ------------------------------------------------------------------------------------------------------------------  Chemistries   Recent Labs Lab 11/15/16 1116  NA 129*  K 3.9  CL  97*  CO2 22  GLUCOSE 103*  BUN 15  CREATININE 0.69  CALCIUM 8.8*  AST 28  ALT 19  ALKPHOS 63  BILITOT 0.6   ------------------------------------------------------------------------------------------------------------------  Cardiac Enzymes  Recent Labs Lab 11/15/16 1116  TROPONINI <0.03   ------------------------------------------------------------------------------------------------------------------  RADIOLOGY:  Dg Chest 2 View  Result Date: 11/15/2016 CLINICAL DATA:  Cough.  Hypoxia. EXAM: CHEST  2 VIEW COMPARISON:  03/28/2011 chest radiograph. FINDINGS: Surgical clips overlie the esophagogastric junction region. Stable cardiomediastinal silhouette with normal heart size and aortic atherosclerosis. No pneumothorax. No pleural effusion. Hyperinflated lungs. No pulmonary edema. No acute consolidative airspace disease. IMPRESSION: Hyperinflated lungs, suggesting COPD. Otherwise no acute pulmonary disease. Aortic atherosclerosis. Electronically Signed   By: Delbert Phenix M.D.   On: 11/15/2016 12:15      IMPRESSION AND PLAN:   Sepsis due to acute bronchitis and possible pneumonia CAP The patient will be admitted to medical floor. Continue antibiotics Zithromax and Rocephin, follow-up cultures and CBC.  Influenza B. Start Tamiflu.  Acute respiratory failure with hypoxia. Continue oxygen [place] and NEB when necessary. Robitussin when necessary.   Possible COPD. Nebulizer when necessary. Hyponatremia. Start normal saline IV and follow-up BMP.  Hematuria. Repeat urinalysis, kidney and bladder ultrasound.  Tobacco abuse. Smoking cessation was counseled for 4 minutes. Given nicotine patch.   All the records are reviewed and case discussed with ED provider. Management plans discussed with the patient, his wife and they are in agreement.  CODE STATUS: Full code  TOTAL TIME TAKING CARE OF THIS PATIENT: 57 minutes.    Shaune Pollack M.D on 11/15/2016 at 1:30 PM  Between 7am to 6pm  - Pager - [phone]  After 6pm go to [email or web address] - Scientist, research (life sciences)  Hospitalists  Office  [phone]  CC: Primary care physician; No PCP Per Patient   Note: This dictation was prepared with Dragon dictation along with smaller phrase technology. Any transcriptional errors that result from this process are unintentional.

## 2016-11-15 NOTE — ED Notes (Signed)
Pt ambulatory to bathroom without difficulty.  

## 2016-11-15 NOTE — Progress Notes (Signed)
Pharmacy Antibiotic Note  [name] is a 61 y.o. male admitted on 11/15/2016 with  pneumonia/CAP.  Pharmacy has been consulted for Azithromycin and Ceftriaxone  dosing. Patient received Azithromycin  IV and Ceftriaxone 1gm IV x 1 dose in ED.   Plan: Will start patient on Ceftriaxone 1gm IV every 24 hours and Azithromycin  IV every 24 hours.  Recommend days of therapy is 5-7 days.   IV-PO transition when patient is clinically stable.   Height:  (175.3 cm) Weight: 120 lb (54.4 kg) IBW/kg (Calculated) : 70.7  Temp (24hrs), Avg:100.1 F (37.8 C), Min:99.7 F (37.6 C), Max:100.4 F (38 C)   Recent Labs Lab 11/15/16 1116  WBC 11.7*  CREATININE 0.69  LATICACIDVEN 0.8    Estimated Creatinine Clearance: 77.4 mL/min (by C-G formula based on SCr of 0.69 mg/dL).    No Known Allergies  Antimicrobials this admission: 4/12 ceftriaxone  >>  4/12 azithromycin >>   Dose adjustments this admission:  Microbiology results: 4/12  BCx: pending 4/12  UCx: pending  Thank you for allowing pharmacy to be a part of this patient's care.  Gardner Candle, PharmD, BCPS Clinical Pharmacist 11/15/2016 2:25 PM

## 2016-11-15 NOTE — ED Triage Notes (Signed)
c/o generalized body aches and fevers with SHOB. sats 90 RA in triage. Pt tachy with fever currently. Mild tachypnea but not labored at this time. Pressure soft. Skin warm and dry.

## 2016-11-15 NOTE — ED Provider Notes (Addendum)
[facility] Emergency Department Provider Note  ____________________________________________  Time seen: Approximately 11:18 AM  I have reviewed the triage vital signs and the nursing notes.   HISTORY  Chief Complaint Shortness of Breath    HPI [name] is a 61 y.o. male with a history of ongoing tobacco abuse presenting with 4-5 days of cough productive of white phlegm, chills, general myalgias. The patient has had some posttussive vomiting but no vomiting outside of cough. No abdominal pain or diarrhea. On arrival to the emergency department, the patient is tachycardic, hypoxic, and febrile.   History reviewed. No pertinent past medical history.  Patient Active Problem List   Diagnosis Date Noted  . Sepsis (HCC) 11/15/2016    Past Surgical History:  Procedure Laterality Date  . ABDOMINAL SURGERY     "burst blood vessel"  . stomach ulcer surgery        Allergies Patient has no known allergies.  Family History  Problem Relation Age of Onset  . Cancer Mother   . Leukemia Father   . Diabetes Brother     Social History Social History  Substance Use Topics  . Smoking status: Current Every Day Smoker  . Smokeless tobacco: Never Used  . Alcohol use Yes    Review of Systems Constitutional: Positive chills. Positive generalized myalgias. No syncope. Eyes: No visual changes. No eye discharge. ENT: No sore throat. Positive congestion and rhinorrhea. Cardiovascular: Denies chest pain. Denies palpitations. Respiratory: Denies shortness of breath.  Positive cough. Gastrointestinal: Positive epigastric abdominal soreness that started after several days of coughing and only hurts with coughing or contraction of the abdominal wall..  No nausea, no vomiting.  No diarrhea.  No constipation. Genitourinary: Negative for dysuria. Musculoskeletal: Negative for back pain. Skin: Negative for rash. Neurological: Negative for headaches. No focal  numbness, tingling or weakness.   10-point ROS otherwise negative.  ____________________________________________   PHYSICAL EXAM:  VITAL SIGNS: ED Triage Vitals  Enc Vitals Group     BP 11/15/16 1106 97/67     Pulse Rate 11/15/16 1106 (!) 126     Resp 11/15/16 1106 (!) 24     Temp 11/15/16 1106 (!) 100.4 F (38 C)     Temp Source 11/15/16 1106 Oral     SpO2 11/15/16 1106 90 %     Weight 11/15/16 1107 120 lb (54.4 kg)     Height 11/15/16 1107  (1.753 m)     Head Circumference --      Peak Flow --      Pain Score 11/15/16 1105 8     Pain Loc --      Pain Edu? --      Excl. in GC? --     Constitutional: Alert and oriented. Chronically ill appearing but nontoxic.Marland Kitchen Answers questions appropriately. Eyes: Conjunctivae are normal.  EOMI. No scleral icterus. No eye discharge. Head: Atraumatic. Nose: No congestion/rhinnorhea. Mouth/Throat: Mucous membranes are moist.  Neck: No stridor.  Supple.  No JVD. No meningismus. Cardiovascular: Fast rate, regular rhythm. No murmurs, rubs or gallops.  Respiratory: The patient is tachypnea With mild accessory muscle use but no retractions. He has rales in the bases bilaterally but no wheezes or rhonchi. He is able to speak in 4-5 word sentences without difficulty. Gastrointestinal: Soft, nontender and nondistended.  No guarding or rebound.  No peritoneal signs. Musculoskeletal: No LE edema. No ttp in the calves or palpable cords.  Negative Homan's sign. Neurologic:  A&Ox3.  Speech is  clear.  Face and smile are symmetric.  EOMI.  Moves all extremities well. Skin:  Skin is warm, dry and intact. No rash noted. Psychiatric: Mood and affect are normal. Speech and behavior are normal.  Normal judgement.  ____________________________________________   LABS (all labs ordered are listed, but only abnormal results are displayed)  Labs Reviewed  COMPREHENSIVE METABOLIC PANEL - Abnormal; Notable for the following:       Result Value   Sodium  129 (*)    Chloride 97 (*)    Glucose, Bld 103 (*)    Calcium 8.8 (*)    All other components within normal limits  CBC WITH DIFFERENTIAL/PLATELET - Abnormal; Notable for the following:    WBC 11.7 (*)    RBC 4.34 (*)    MCV 101.5 (*)    MCH 34.9 (*)    Neutro Abs 8.9 (*)    Monocytes Absolute 1.7 (*)    All other components within normal limits  URINALYSIS, ROUTINE W REFLEX MICROSCOPIC - Abnormal; Notable for the following:    Color, Urine AMBER (*)    APPearance HAZY (*)    Hgb urine dipstick LARGE (*)    Ketones, ur 20 (*)    Protein, ur 30 (*)    Squamous Epithelial / LPF 0-5 (*)    All other components within normal limits  BLOOD GAS, VENOUS - Abnormal; Notable for the following:    pCO2, Ven 37 (*)    pO2, Ven 62.0 (*)    All other components within normal limits  INFLUENZA PANEL BY PCR (TYPE A & B) - Abnormal; Notable for the following:    Influenza B By PCR POSITIVE (*)    All other components within normal limits  CULTURE, BLOOD (ROUTINE X 2)  CULTURE, BLOOD (ROUTINE X 2)  URINE CULTURE  LACTIC ACID, PLASMA  TROPONIN I   ____________________________________________  EKG  ED ECG REPORT I, Rockne Menghini, the attending physician, personally viewed and interpreted this ECG.   Date: 11/15/2016  EKG Time: 1126  Rate: 110  Rhythm: sinus tachycardia  Axis: normal  Intervals:none  ST&T Change: No STEMI. + RBBB  ____________________________________________  RADIOLOGY  Dg Chest 2 View  Result Date: 11/15/2016 CLINICAL DATA:  Cough.  Hypoxia. EXAM: CHEST  2 VIEW COMPARISON:  03/28/2011 chest radiograph. FINDINGS: Surgical clips overlie the esophagogastric junction region. Stable cardiomediastinal silhouette with normal heart size and aortic atherosclerosis. No pneumothorax. No pleural effusion. Hyperinflated lungs. No pulmonary edema. No acute consolidative airspace disease. IMPRESSION: Hyperinflated lungs, suggesting COPD. Otherwise no acute pulmonary  disease. Aortic atherosclerosis. Electronically Signed   By: Delbert Phenix M.D.   On: 11/15/2016 12:15    ____________________________________________   PROCEDURES  Procedure(s) performed: None  Procedures  Critical Care performed: yes ____________________________________________   INITIAL IMPRESSION / ASSESSMENT AND PLAN / ED COURSE  Pertinent labs & imaging results that were available during my care of the patient were reviewed by me and considered in my medical decision making (see chart for details).  61 y.o. male with a history of ongoing tobacco abuse presenting with sepsis criteria and the setting of cough, myalgias, and chills. Overall, I am concerned about pneumonia but I would also consider influenza as a possible cause. A code sepsis has been called and immediate fluid resuscitation as well as antibiotics will be administered. The patient will require admission for further evaluation and treatment.  CRITICAL CARE Performed by: Rockne Menghini   Total critical care time: 45 minutes  Critical  care time was exclusive of separately billable procedures and treating other patients.  Critical care was necessary to treat or prevent imminent or life-threatening deterioration.  Critical care was time spent personally by me on the following activities: development of treatment plan with patient and/or surrogate as well as nursing, discussions with consultants, evaluation of patient's response to treatment, examination of patient, obtaining history from patient or surrogate, ordering and performing treatments and interventions, ordering and review of laboratory studies, ordering and review of radiographic studies, pulse oximetry and re-evaluation of patient's condition.  ED Clinical Course: The patient's VS stabilized with an anti-pyretic and IVF.  The patient did not have an infiltrate on CXR, but possible radiographic lag.  Influenza testing pending at time of admission to  the hospitalist.  ----------------------------------------- 3:27 PM on 11/15/2016 -----------------------------------------  Patient is positive for influenza B and Tamiflu has been ordered ____________________________________________  FINAL CLINICAL IMPRESSION(S) / ED DIAGNOSES  Final diagnoses:  Hyponatremia  Sepsis, due to unspecified organism [facility])  Community acquired pneumonia, unspecified laterality         NEW MEDICATIONS STARTED DURING THIS VISIT:  New Prescriptions   No medications on file      Rockne Menghini, MD 11/15/16 1521    Rockne Menghini, MD 11/15/16 1526    Rockne Menghini, MD 11/15/16 1527

## 2016-11-15 NOTE — Plan of Care (Signed)
Problem: Safety: Goal: Ability to remain free from injury will improve Outcome: Progressing Bed in lowest position, bed alarm on call bell within reach.

## 2016-11-16 ENCOUNTER — Inpatient Hospital Stay: Payer: Self-pay

## 2016-11-16 LAB — BASIC METABOLIC PANEL
ANION GAP: 5 (ref 5–15)
BUN: 7 mg/dL (ref 6–20)
CALCIUM: 7.9 mg/dL — AB (ref 8.9–10.3)
CO2: 23 mmol/L (ref 22–32)
CREATININE: 0.49 mg/dL — AB (ref 0.61–1.24)
Chloride: 99 mmol/L — ABNORMAL LOW (ref 101–111)
GFR calc Af Amer: >60 mL/min (ref >60)
GFR calc non Af Amer: >60 mL/min (ref >60)
GLUCOSE: 86 mg/dL (ref 65–99)
Potassium: 3.8 mmol/L (ref 3.5–5.1)
Sodium: 127 mmol/L — ABNORMAL LOW (ref 135–145)

## 2016-11-16 LAB — URINALYSIS, COMPLETE (UACMP) WITH MICROSCOPIC
Bacteria, UA: NONE SEEN
Bilirubin Urine: NEGATIVE
GLUCOSE, UA: NEGATIVE mg/dL
Ketones, ur: 20 mg/dL — AB
Leukocytes, UA: NEGATIVE
Nitrite: NEGATIVE
PROTEIN: NEGATIVE mg/dL
SPECIFIC GRAVITY, URINE: 1.015 (ref 1.005–1.030)
WBC UA: NONE SEEN WBC/hpf (ref 0–5)
pH: 5 (ref 5.0–8.0)

## 2016-11-16 LAB — CBC
HCT: 35.4 % — ABNORMAL LOW (ref 40.0–52.0)
HEMOGLOBIN: 12.4 g/dL — AB (ref 13.0–18.0)
MCH: 35.2 pg — AB (ref 26.0–34.0)
MCHC: 34.9 g/dL (ref 32.0–36.0)
MCV: 100.8 fL — ABNORMAL HIGH (ref 80.0–100.0)
Platelets: 125 10*3/uL — ABNORMAL LOW (ref 150–440)
RBC: 3.52 MIL/uL — ABNORMAL LOW (ref 4.40–5.90)
RDW: 12.9 % (ref 11.5–14.5)
WBC: 6.6 10*3/uL (ref 3.8–10.6)

## 2016-11-16 LAB — URINE CULTURE: Culture: <10000 — AB

## 2016-11-16 LAB — HIV ANTIBODY (ROUTINE TESTING W REFLEX): HIV SCREEN 4TH GENERATION: NONREACTIVE

## 2016-11-16 LAB — OSMOLALITY, URINE: Osmolality, Ur: 380 mOsm/kg (ref 300–900)

## 2016-11-16 LAB — OSMOLALITY: OSMOLALITY: 270 mosm/kg — AB (ref 275–295)

## 2016-11-16 MED ORDER — GUAIFENESIN ER 600 MG PO TB12
600.0000 mg | ORAL_TABLET | Freq: Two times a day (BID) | ORAL | Status: DC
  Administered 2016-11-16 – 2016-11-17 (×3): 600 mg via ORAL
  Filled 2016-11-16 (×4): qty 1

## 2016-11-16 MED ORDER — METHYLPREDNISOLONE SODIUM SUCC 125 MG IJ SOLR
60.0000 mg | Freq: Two times a day (BID) | INTRAMUSCULAR | Status: DC
  Administered 2016-11-16 (×2): 60 mg via INTRAVENOUS
  Filled 2016-11-16 (×2): qty 2

## 2016-11-16 MED ORDER — BUDESONIDE 0.25 MG/2ML IN SUSP
0.2500 mg | Freq: Two times a day (BID) | RESPIRATORY_TRACT | Status: DC
  Administered 2016-11-16 – 2016-11-17 (×3): 0.25 mg via RESPIRATORY_TRACT
  Filled 2016-11-16 (×3): qty 2

## 2016-11-16 MED ORDER — OSELTAMIVIR PHOSPHATE 75 MG PO CAPS
75.0000 mg | ORAL_CAPSULE | Freq: Two times a day (BID) | ORAL | Status: DC
Start: 2016-11-16 — End: 2016-11-17
  Administered 2016-11-16 – 2016-11-17 (×4): 75 mg via ORAL
  Filled 2016-11-16 (×4): qty 1

## 2016-11-16 MED ORDER — IPRATROPIUM-ALBUTEROL 0.5-2.5 (3) MG/3ML IN SOLN
3.0000 mL | Freq: Four times a day (QID) | RESPIRATORY_TRACT | Status: DC
  Administered 2016-11-16 – 2016-11-17 (×5): 3 mL via RESPIRATORY_TRACT
  Filled 2016-11-16 (×5): qty 3

## 2016-11-16 NOTE — Progress Notes (Signed)
[employer] - [place] at [facility]                                                                                                                                                                                  Patient Demographics   [name], is a 61 y.o. male, DOB - 10-21-1955, JXB:[id number]  Admit date - 11/15/2016   Admitting Physician Shaune Pollack, MD  Outpatient Primary MD for the patient is No PCP Per Patient   LOS - 1  Subjective:  Patient admitted with shortness of breath cough. Continues to have those symptoms.   Review of Systems:   CONSTITUTIONAL: No documented fever. No fatigue, weakness. No weight gain, no weight loss.  EYES: No blurry or double vision.  ENT: No tinnitus. No postnasal drip. No redness of the oropharynx.  RESPIRATORY: Positive cough, no wheeze,  No hemoptysis. Positive dyspnea.  CARDIOVASCULAR: No chest pain. No orthopnea. No palpitations. No syncope.  GASTROINTESTINAL: No nausea, no vomiting or diarrhea. No abdominal pain. No melena or hematochezia.  GENITOURINARY: No dysuria or positive hematuria.  ENDOCRINE: No polyuria or nocturia. No heat or cold intolerance.  HEMATOLOGY: No anemia. No bruising. No bleeding.  INTEGUMENTARY: No rashes. No lesions.  MUSCULOSKELETAL: No arthritis. No swelling. No gout.  NEUROLOGIC: No numbness, tingling, or ataxia. No seizure-type activity.  PSYCHIATRIC: No anxiety. No insomnia. No ADD.    Vitals:   Vitals:   11/16/16 0453 11/16/16 0758 11/16/16 1252 11/16/16 1308  BP: (!) 97/48 (!) 110/58 109/74   Pulse: 84 78 79   Resp: Temp: 98.7 F (37.1 C) 98.2 F (36.8 C) 97.6 F (36.4 C)   TempSrc: Oral Oral Oral   SpO2: 98% 98% 99% 96%  Weight:      Height:        Wt Readings from Last 3 Encounters:  11/15/16 119 lb (54 kg)     Intake/Output Summary (Last 24 hours) at 11/16/16 1422 Last data filed at 11/16/16 0514  Gross per 24 hour  Intake          1071.67 ml  Output               850 ml  Net           221.67 ml    Physical Exam:   GENERAL: Pleasant-appearing in no apparent distress.  HEAD, EYES, EARS, NOSE AND THROAT: Atraumatic, normocephalic. Extraocular muscles are intact. Pupils equal and reactive to light. Sclerae anicteric. No conjunctival injection. No oro-pharyngeal erythema.  NECK: Supple. There is no jugular venous distention. No bruits, no lymphadenopathy, no thyromegaly.  HEART: Regular rate and rhythm,. No murmurs,  no rubs, no clicks.  LUNGS: Diminished breath sounds bilaterally  ABDOMEN: Soft, flat, nontender, nondistended. Has good bowel sounds. No hepatosplenomegaly appreciated.  EXTREMITIES: No evidence of any cyanosis, clubbing, or peripheral edema.  +2 pedal and radial pulses bilaterally.  NEUROLOGIC: The patient is alert, awake, and oriented x3 with no focal motor or sensory deficits appreciated bilaterally.  SKIN: Moist and warm with no rashes appreciated.  Psych: Not anxious, depressed LN: No inguinal LN enlargement    Antibiotics   Anti-infectives    Start     Dose/Rate Route Frequency Ordered Stop   11/16/16 1200  azithromycin (ZITHROMAX) 500 mg in dextrose 5 % 250 mL IVPB     500 mg 250 mL/hr over 60 Minutes Intravenous Every 24 hours 11/15/16 1429     11/16/16 1200  cefTRIAXone (ROCEPHIN) 1 g in dextrose 5 % 50 mL IVPB     1 g 100 mL/hr over 30 Minutes Intravenous Every 24 hours 11/15/16 1429     11/16/16 0045  oseltamivir (TAMIFLU) capsule 75 mg     75 mg Oral 2 times daily 11/16/16 0043 11/20/16 2159   11/15/16 1530  oseltamivir (TAMIFLU) capsule 75 mg     75 mg Oral  Once 11/15/16 1527 11/15/16 1638   11/15/16 1145  cefTRIAXone (ROCEPHIN) IVPB 1 g     1 g 100 mL/hr over 30 Minutes Intravenous  Once 11/15/16 1131 11/15/16 1221   11/15/16 1130  azithromycin (ZITHROMAX) 500 mg in dextrose 5 % 250 mL IVPB     500 mg 250 mL/hr over 60 Minutes Intravenous  Once 11/15/16 1117 11/15/16 1320   11/15/16 1130  cefTRIAXone  (ROCEPHIN) 1 g in dextrose 5 % 50 mL IVPB  Status:  Discontinued     1 g 100 mL/hr over 30 Minutes Intravenous  Once 11/15/16 1117 11/15/16 1131      Medications   Scheduled Meds: . azithromycin  500 mg Intravenous Q24H  . budesonide (PULMICORT) nebulizer solution  0.25 mg Nebulization BID  . cefTRIAXone (ROCEPHIN) IVPB 1 gram/50 mL D5W  1 g Intravenous Q24H  . guaiFENesin  600 mg Oral BID  . ipratropium-albuterol  3 mL Nebulization Q6H  . methylPREDNISolone (SOLU-MEDROL) injection  60 mg Intravenous Q12H  . nicotine  14 mg Transdermal Daily  . oseltamivir  75 mg Oral BID  . sodium chloride flush  3 mL Intravenous Q12H  . sodium chloride flush  3 mL Intravenous Q12H   Continuous Infusions: PRN Meds:.sodium chloride, acetaminophen **OR** acetaminophen, guaiFENesin, HYDROcodone-acetaminophen, ketorolac, ondansetron **OR** ondansetron (ZOFRAN) IV, senna-docusate, sodium chloride, sodium chloride flush, zolpidem   Data Review:   Micro Results Recent Results (from the past 240 hour(s))  Blood Culture (routine x 2)     Status: None (Preliminary result)   Collection Time: 11/15/16 11:16 AM  Result Value Ref Range Status   Specimen Description BLOOD R WRIST  Final   Special Requests   Final    BOTTLES DRAWN AEROBIC AND ANAEROBIC Blood Culture adequate volume   Culture NO GROWTH < 24 HOURS  Final   Report Status PENDING  Incomplete  Blood Culture (routine x 2)     Status: None (Preliminary result)   Collection Time: 11/15/16 11:16 AM  Result Value Ref Range Status   Specimen Description BLOOD R HAND  Final   Special Requests   Final    BOTTLES DRAWN AEROBIC AND ANAEROBIC Blood Culture adequate volume   Culture NO GROWTH < 24 HOURS  Final  Report Status PENDING  Incomplete  Urine culture     Status: Abnormal   Collection Time: 11/15/16 11:38 AM  Result Value Ref Range Status   Specimen Description URINE, RANDOM  Final   Special Requests NONE  Final   Culture (A)  Final     <10,000 COLONIES/mL INSIGNIFICANT GROWTH Performed at [facility], [address] [place], [place] [address]    Report Status 11/16/2016 FINAL  Final    Radiology Reports Dg Chest 2 View  Result Date: 11/15/2016 CLINICAL DATA:  Cough.  Hypoxia. EXAM: CHEST  2 VIEW COMPARISON:  03/28/2011 chest radiograph. FINDINGS: Surgical clips overlie the esophagogastric junction region. Stable cardiomediastinal silhouette with normal heart size and aortic atherosclerosis. No pneumothorax. No pleural effusion. Hyperinflated lungs. No pulmonary edema. No acute consolidative airspace disease. IMPRESSION: Hyperinflated lungs, suggesting COPD. Otherwise no acute pulmonary disease. Aortic atherosclerosis. Electronically Signed   By: Delbert Phenix M.D.   On: 11/15/2016 12:15   US Renal  Result Date: 11/16/2016 CLINICAL DATA:  Microscopic hematuria EXAM: RENAL / URINARY TRACT ULTRASOUND COMPLETE COMPARISON:  09/29/2010 CT FINDINGS: Right Kidney: Length: 11.6 cm possible small shadowing nonobstructing left lower pole stone measuring 5 mm. Echogenicity within normal limits. No mass or hydronephrosis visualized. Left Kidney: Length: 12 cm. Possible small shadowing nonobstructing stone in the midpole measuring 4 mm. Echogenicity within normal limits. No mass or hydronephrosis visualized. Bladder: Appears normal for degree of bladder distention. IMPRESSION: Possible bilateral nephrolithiasis. No hydronephrosis. No acute findings. Electronically Signed   By: Charlett Nose M.D.   On: 11/16/2016 10:09     CBC  Recent Labs Lab 11/15/16 1116 11/16/16 0824  WBC 11.7* 6.6  HGB 15.1 12.4*  HCT 44.1 35.4*  PLT 153 125*  MCV 101.5* 100.8*  MCH 34.9* 35.2*  MCHC 34.4 34.9  RDW 13.0 12.9  LYMPHSABS 1.0  --   MONOABS 1.7*  --   EOSABS 0.0  --   BASOSABS 0.0  --     Chemistries   Recent Labs Lab 11/15/16 1116 11/16/16 0824  NA 129* 127*  K 3.9 3.8  CL 97* 99*  CO2 22 23  GLUCOSE 103* 86  BUN 15 7   CREATININE 0.69 0.49*  CALCIUM 8.8* 7.9*  AST 28  --   ALT 19  --   ALKPHOS 63  --   BILITOT 0.6  --    ------------------------------------------------------------------------------------------------------------------ estimated creatinine clearance is 76.9 mL/min (A) (by C-G formula based on SCr of 0.49 mg/dL (L)). ------------------------------------------------------------------------------------------------------------------ No results for input(s): HGBA1C in the last 72 hours. ------------------------------------------------------------------------------------------------------------------ No results for input(s): CHOL, HDL, LDLCALC, TRIG, CHOLHDL, LDLDIRECT in the last 72 hours. ------------------------------------------------------------------------------------------------------------------ No results for input(s): TSH, T4TOTAL, T3FREE, THYROIDAB in the last 72 hours.  Invalid input(s): FREET3 ------------------------------------------------------------------------------------------------------------------ No results for input(s): VITAMINB12, FOLATE, FERRITIN, TIBC, IRON, RETICCTPCT in the last 72 hours.  Coagulation profile  Recent Labs Lab 11/15/16 1650  INR 0.89    No results for input(s): DDIMER in the last 72 hours.  Cardiac Enzymes  Recent Labs Lab 11/15/16 1116  TROPONINI <0.03   ------------------------------------------------------------------------------------------------------------------ Invalid input(s): POCBNP    Assessment & Plan   Patient is a 61 year old with cough shortness of breath   1. Sepsis due to acute bronchitis and influenza B Continue Tamiflu.  2. Acute respiratory failure with hypoxia. Continue oxygen  With associated bronchospasm continued therapy with nebulizers steroids  3. Possible COPD exasperation. Nebulizer when necessary. I will add steroids to current regimen  4. Hyponatremia. Worse with IV hydration I'll check  serum osmolality and urine osmolality   5. Hematuria. Renal ultrasound shows nonobstructive renal stones will need out pt urology f/u   6. Tobacco abuse. Smoking cessation was provided     Code Status Orders        Start     Ordered   11/15/16 1623  Full code  Continuous     11/15/16 1622    Code Status History    Date Active Date Inactive Code Status Order ID Comments User Context   This patient has a current code status but no historical code status.           Consults  none  DVT Prophylaxis  Lovenox   Lab Results  Component Value Date   PLT 125 (L) 11/16/2016     Time Spent in minutes   Greater than 50% of time spent in care coordination and counseling patient regarding the condition and plan of care.   Auburn Bilberry M.D on 11/16/2016 at 2:22 PM  Between 7am to 6pm - Pager - [phone]  After 6pm go to [email or web address] - password [email or web address] [facility]  [facility]   Office  [phone]

## 2016-11-16 NOTE — Progress Notes (Signed)
Initial Nutrition Assessment  DOCUMENTATION CODES:   Underweight, Severe malnutrition in context of chronic illness  INTERVENTION:  Recommend Carnation Instant Breakfast TID in whole milk, each supplement provides 280 kcal and 13 grams of protein.  Encouraged regular intake of at least 3 meals daily and ideally snacks between meals. Encouraged adequate intake of calories and protein at meals and snacks.  NUTRITION DIAGNOSIS:   Malnutrition (Severe) related to chronic illness (COPD) as evidenced by severe depletion of body fat, severe depletion of muscle mass.  GOAL:   Patient will meet greater than or equal to 90% of their needs  MONITOR:   PO intake, Supplement acceptance, Labs, Weight trends, I & O's  REASON FOR ASSESSMENT:   Other (Comment) (Low BMI)    ASSESSMENT:   61 year old male with no PMHx who presented with SOB and cough, found to have sepsis due to acute bronchitis and influenza B, acute respiratory failure, possible COPD exacerbation (would be new diagnosis).   Spoke with patient and his significant other Maralyn Sago) at bedside. Patient reports his appetite is always poor but it has been even worse the past few weeks. He has been eating only bites of meals due to early satiety, nausea, abdominal pain. His usual intake is 2-4 meals per day. 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 Discussed with patient that it is actually all mammal meat he has to avoid - patient then remembered he did have a reaction once after eating pork.   UBW 125-130 lbs. Patient reports he has lost at least 6 lbs (4.8% body weight) over the past 1-2 weeks, which is significant for time frame.  Medications reviewed and include: azithromycin, ceftriaxone, methylprednisolone 60 mg Q12hrs, Tamiflu, Zofran PRN.  Labs reviewed: Sodium 127, Chloride 99, Creatinine 0.49.   Nutrition-Focused physical exam completed. Findings are severe fat depletion, severe  muscle depletion, and no edema.   Discussed with RN.  Diet Order:  Diet regular Room service appropriate? Yes; Fluid consistency: Thin  Skin:  Reviewed, no issues  Last BM:  11/15/2016  Height:   Ht Readings from Last 1 Encounters:  11/15/16  (1.753 m)    Weight:   Wt Readings from Last 1 Encounters:  11/15/16 119 lb (54 kg)    Ideal Body Weight:  72.7 kg  BMI:  Body mass index is 17.57 kg/m.  Estimated Nutritional Needs:   Kcal:  1630-1900 (MSJ x 1.2-1.4)  Protein:  80-90 grams (1.5-1.7 grams/kg)  Fluid:  1.6-1.9 L/day (30-35 ml/kg)  EDUCATION NEEDS:   No education needs identified at this time  Helane Rima, MS, RD, LDN Pager: [phone] After Hours Pager: [phone]

## 2016-11-16 NOTE — Progress Notes (Signed)
Pharmacy Antibiotic Note  [name] is a 61 y.o. male admitted on 11/15/2016 with  pneumonia/CAP.  Pharmacy has been consulted for Azithromycin and Ceftriaxone  dosing. Patient received Azithromycin  IV and Ceftriaxone 1gm IV x 1 dose in ED.   Plan: Day 2-  Ceftriaxone 1gm IV every 24 hours and Azithromycin  IV every 24 hours.  Recommend days of therapy is 5-7 days.   IV-PO transition when patient is clinically stable.   Height:  (175.3 cm) Weight: 119 lb (54 kg) IBW/kg (Calculated) : 70.7  Temp (24hrs), Avg:99.2 F (37.3 C), Min:98.2 F (36.8 C), Max:100.4 F (38 C)   Recent Labs Lab 11/15/16 1116 11/16/16 0824  WBC 11.7* 6.6  CREATININE 0.69 0.49*  LATICACIDVEN 0.8  --     Estimated Creatinine Clearance: 76.9 mL/min (A) (by C-G formula based on SCr of 0.49 mg/dL (L)).    No Known Allergies  Antimicrobials this admission: 4/12 ceftriaxone  >>  4/12 azithromycin >>   Dose adjustments this admission:  Microbiology results: 4/12  BCx: pending 4/12  UCx: pending  Thank you for allowing pharmacy to be a part of this patient's care.  Angelique Blonder, PharmD, BCPS Clinical Pharmacist 11/16/2016 10:54 AM

## 2016-11-17 LAB — BASIC METABOLIC PANEL
ANION GAP: 7 (ref 5–15)
BUN: 9 mg/dL (ref 6–20)
CO2: 26 mmol/L (ref 22–32)
Calcium: 8.4 mg/dL — ABNORMAL LOW (ref 8.9–10.3)
Chloride: 99 mmol/L — ABNORMAL LOW (ref 101–111)
Creatinine, Ser: 0.53 mg/dL — ABNORMAL LOW (ref 0.61–1.24)
GFR calc Af Amer: >60 mL/min (ref >60)
GLUCOSE: 183 mg/dL — AB (ref 65–99)
POTASSIUM: 4.1 mmol/L (ref 3.5–5.1)
Sodium: 132 mmol/L — ABNORMAL LOW (ref 135–145)

## 2016-11-17 LAB — CBC
HEMATOCRIT: 37.3 % — AB (ref 40.0–52.0)
Hemoglobin: 13.2 g/dL (ref 13.0–18.0)
MCH: 35.6 pg — ABNORMAL HIGH (ref 26.0–34.0)
MCHC: 35.3 g/dL (ref 32.0–36.0)
MCV: 100.8 fL — AB (ref 80.0–100.0)
PLATELETS: 159 10*3/uL (ref 150–440)
RBC: 3.7 MIL/uL — AB (ref 4.40–5.90)
RDW: 12.8 % (ref 11.5–14.5)
WBC: 3.3 10*3/uL — AB (ref 3.8–10.6)

## 2016-11-17 MED ORDER — ALBUTEROL SULFATE HFA 108 (90 BASE) MCG/ACT IN AERS: 2.0000 | INHALATION_SPRAY | Freq: Four times a day (QID) | RESPIRATORY_TRACT | 0 refills | Status: AC | PRN

## 2016-11-17 MED ORDER — PREDNISONE 5 MG PO TABS: ORAL_TABLET | ORAL | 0 refills | Status: DC

## 2016-11-17 MED ORDER — BECLOMETHASONE DIPROPIONATE 40 MCG/ACT IN AERS: 1.0000 | INHALATION_SPRAY | Freq: Two times a day (BID) | RESPIRATORY_TRACT | 0 refills | Status: DC

## 2016-11-17 MED ORDER — AZITHROMYCIN 500 MG PO TABS
250.0000 mg | ORAL_TABLET | Freq: Every day | ORAL | Status: DC
Start: 2016-11-17 — End: 2016-11-17

## 2016-11-17 MED ORDER — NICOTINE 14 MG/24HR TD PT24: 14.0000 mg | MEDICATED_PATCH | Freq: Every day | TRANSDERMAL | 0 refills | Status: DC

## 2016-11-17 MED ORDER — OSELTAMIVIR PHOSPHATE 75 MG PO CAPS: ORAL_CAPSULE | ORAL | 0 refills | Status: DC

## 2016-11-17 MED ORDER — AZITHROMYCIN 250 MG PO TABS: ORAL_TABLET | ORAL | 0 refills | Status: DC

## 2016-11-17 NOTE — Care Management Note (Signed)
Case Management Note  Patient Details  Name: [name] MRN: [id number] Date of Birth: Dec 07, 1955  Subjective/Objective:    Provided uninsured [name] with a MATCH Program coupon and list of participating pharmacies. [name] reports that he "is bad about not following up with things," so his Clinical research associate asked that his wife speak with the case manager when she arrives at [facility] today so that she can be provided with a list of clinics that serve the uninsured.                 Action/Plan:   Expected Discharge Date:  11/17/16               Expected Discharge Plan:     In-House Referral:     Discharge planning Services     Post Acute Care Choice:    Choice offered to:     DME Arranged:    DME Agency:     HH Arranged:    HH Agency:     Status of Service:     If discussed at [employer], dates discussed:    Additional Comments:  Avner Stroder A, RN 11/17/2016, 10:28 AM

## 2016-11-17 NOTE — Progress Notes (Signed)
Pt is being discharged today, IV x1 removed. Discharge instructions reviewed with the patient, he verbalized understanding. 6 paper prescriptions were given to the patient. All belongings were packed and returned to the patient. Pt is currently waiting on his ride, he will be rolled out in a wheelchair by staff.

## 2016-11-17 NOTE — Discharge Summary (Signed)
[employer] - [place] at [facility]   PATIENT NAME: [name]    MR#:  [id number]  DATE OF BIRTH:  Oct 10, 1955  DATE OF ADMISSION:  11/15/2016 ADMITTING PHYSICIAN: Shaune Pollack, MD  DATE OF DISCHARGE: 11/17/2016  PRIMARY CARE PHYSICIAN: [facility]    ADMISSION DIAGNOSIS:  Hyponatremia [E87.1] Influenza B [J10.1] Sepsis, due to unspecified organism (HCC) [A41.9] Community acquired pneumonia, unspecified laterality [J18.9]  DISCHARGE DIAGNOSIS:  Active Problems:   Sepsis (HCC)   SECONDARY DIAGNOSIS:   Past Medical History:  Diagnosis Date  . Ulcer     HOSPITAL COURSE:   1.  Clinical sepsis present on admission secondary to influenza B. Finish up course of Tamiflu. Chest x-ray did not show pneumonia. This is all secondary to influenza. 2. Acute hypoxic respiratory failure. The patient was tapered off oxygen. 3. COPD exacerbation. Quick prednisone taper. Prescribed albuterol and Qvar upon going home. Can finish up course of Zithromax. 4. Tobacco abuse smoking cessation counseling done for minutes by me 5. Hyponatremia. Improved. Likely secondary to poor appetite 6. Hematuria. Renal ultrasound showed nonobstructing renal stones. Outpatient urology follow-up.  DISCHARGE CONDITIONS:   Satisfactory  CONSULTS OBTAINED:   none  DRUG ALLERGIES:  No Known Allergies  DISCHARGE MEDICATIONS:   Current Discharge Medication List    START taking these medications   Details  albuterol (PROVENTIL HFA;VENTOLIN HFA) 108 (90 Base) MCG/ACT inhaler Inhale 2 puffs into the lungs every 6 (six) hours as needed for wheezing or shortness of breath. Qty: 1 Inhaler, Refills: 0    azithromycin (ZITHROMAX) 250 MG tablet One tab daily for two more days Qty: 2 tablet, Refills: 0    beclomethasone (QVAR) 40 MCG/ACT inhaler Inhale 1 puff into the lungs 2 (two) times daily. Qty: 1 Inhaler, Refills: 0    nicotine (NICODERM CQ - DOSED IN MG/24 HOURS) 14 mg/24hr patch Place 1  patch (14 mg total) onto the skin daily. Qty: 28 patch, Refills: 0    oseltamivir (TAMIFLU) 75 MG capsule One tab twice a day for 5 more doses Qty: 5 capsule, Refills: 0    predniSONE (DELTASONE) 5 MG tablet 6 tabs po day1; 4 tabs po day2; 3 tabs po day3; 2 tabs po day4; 1 tab po day5 Qty: 18 tablet, Refills: 0         DISCHARGE INSTRUCTIONS:   Satisfactory  If you experience worsening of your admission symptoms, develop shortness of breath, life threatening emergency, suicidal or homicidal thoughts you must seek medical attention immediately by calling 911 or calling your MD immediately  if symptoms less severe.  You Must read complete instructions/literature along with all the possible adverse reactions/side effects for all the Medicines you take and that have been prescribed to you. Take any new Medicines after you have completely understood and accept all the possible adverse reactions/side effects.   Please note  You were cared for by a hospitalist during your hospital stay. If you have any questions about your discharge medications or the care you received while you were in the hospital after you are discharged, you can call the unit and asked to speak with the hospitalist on call if the hospitalist that took care of you is not available. Once you are discharged, your primary care physician will handle any further medical issues. Please note that NO REFILLS for any discharge medications will be authorized once you are discharged, as it is imperative that you return to your primary care physician (or establish a relationship  with a primary care physician if you do not have one) for your aftercare needs so that they can reassess your need for medications and monitor your lab values.    Today   CHIEF COMPLAINT:   Chief Complaint  Patient presents with  . Shortness of Breath    HISTORY OF PRESENT ILLNESS:  [name]  is a 61 y.o. male presented with shortness of  breath   VITAL SIGNS:  Blood pressure 126/64, pulse 76, temperature 98 F (36.7 C), temperature source Oral, resp. rate 18, height  (1.753 m), weight 54 kg (119 lb), SpO2 92 %.    PHYSICAL EXAMINATION:  GENERAL:  61 y.o.-year-old patient lying in the bed with no acute distress.  EYES: Pupils equal, round, reactive to light and accommodation. No scleral icterus. Extraocular muscles intact.  HEENT: Head atraumatic, normocephalic. Oropharynx and nasopharynx clear.  NECK:  Supple, no jugular venous distention. No thyroid enlargement, no tenderness.  LUNGS: Normal breath sounds bilaterally, no wheezing, rales,rhonchi or crepitation. No use of accessory muscles of respiration.  CARDIOVASCULAR: S1, S2 normal. No murmurs, rubs, or gallops.  ABDOMEN: Soft, non-tender, non-distended. Bowel sounds present. No organomegaly or mass.  EXTREMITIES: No pedal edema, cyanosis, or clubbing.  NEUROLOGIC: Cranial nerves II through XII are intact. Muscle strength 5/5 in all extremities. Sensation intact. Gait not checked.  PSYCHIATRIC: The patient is alert and oriented x 3.  SKIN: No obvious rash, lesion, or ulcer.   DATA REVIEW:   CBC  Recent Labs Lab 11/17/16 0616  WBC 3.3*  HGB 13.2  HCT 37.3*  PLT 159    Chemistries   Recent Labs Lab 11/15/16 1116  11/17/16 0616  NA 129*  < > 132*  K 3.9  < > 4.1  CL 97*  < > 99*  CO2 22  < > 26  GLUCOSE 103*  < > 183*  BUN 15  < > 9  CREATININE 0.69  < > 0.53*  CALCIUM 8.8*  < > 8.4*  AST 28  --   --   ALT 19  --   --   ALKPHOS 63  --   --   BILITOT 0.6  --   --   < > = values in this interval not displayed.  Cardiac Enzymes  Recent Labs Lab 11/15/16 1116  TROPONINI <0.03    Microbiology Results  Results for orders placed or performed during the hospital encounter of 11/15/16  Blood Culture (routine x 2)     Status: None (Preliminary result)   Collection Time: 11/15/16 11:16 AM  Result Value Ref Range Status   Specimen  Description BLOOD R WRIST  Final   Special Requests   Final    BOTTLES DRAWN AEROBIC AND ANAEROBIC Blood Culture adequate volume   Culture NO GROWTH 2 DAYS  Final   Report Status PENDING  Incomplete  Blood Culture (routine x 2)     Status: None (Preliminary result)   Collection Time: 11/15/16 11:16 AM  Result Value Ref Range Status   Specimen Description BLOOD R HAND  Final   Special Requests   Final    BOTTLES DRAWN AEROBIC AND ANAEROBIC Blood Culture adequate volume   Culture NO GROWTH 2 DAYS  Final   Report Status PENDING  Incomplete  Urine culture     Status: Abnormal   Collection Time: 11/15/16 11:38 AM  Result Value Ref Range Status   Specimen Description URINE, RANDOM  Final   Special Requests NONE  Final  Culture (A)  Final    <10,000 COLONIES/mL INSIGNIFICANT GROWTH Performed at [facility], [address] [place], [place] [address]    Report Status 11/16/2016 FINAL  Final    RADIOLOGY:  US Renal  Result Date: 11/16/2016 CLINICAL DATA:  Microscopic hematuria EXAM: RENAL / URINARY TRACT ULTRASOUND COMPLETE COMPARISON:  09/29/2010 CT FINDINGS: Right Kidney: Length: 11.6 cm possible small shadowing nonobstructing left lower pole stone measuring 5 mm. Echogenicity within normal limits. No mass or hydronephrosis visualized. Left Kidney: Length: 12 cm. Possible small shadowing nonobstructing stone in the midpole measuring 4 mm. Echogenicity within normal limits. No mass or hydronephrosis visualized. Bladder: Appears normal for degree of bladder distention. IMPRESSION: Possible bilateral nephrolithiasis. No hydronephrosis. No acute findings. Electronically Signed   By: Charlett Nose M.D.   On: 11/16/2016 10:09    Management plans discussed with the patient, And he is in agreement.  CODE STATUS:     Code Status Orders        Start     Ordered   11/15/16 1623  Full code  Continuous     11/15/16 1622    Code Status History    Date Active Date Inactive Code Status  Order ID Comments User Context   This patient has a current code status but no historical code status.      TOTAL TIME TAKING CARE OF THIS PATIENT: 35 minutes.    Alford Highland M.D on 11/17/2016 at 1:34 PM  Between 7am to 6pm - Pager - [phone]  After 6pm go to [email or web address] - password [email or web address]  [employer] Office  [phone]  CC: Primary care physician; No PCP Per Patient

## 2016-11-20 LAB — CULTURE, BLOOD (ROUTINE X 2)
CULTURE: NO GROWTH
Culture: NO GROWTH
SPECIAL REQUESTS: ADEQUATE
SPECIAL REQUESTS: ADEQUATE

## 2016-12-10 NOTE — Progress Notes (Signed)
12/11/2016 3:02 PM   [name] [birth year] [id number]  Referring provider: No referring provider defined for this encounter.  Chief Complaint  Patient presents with  . New Patient (Initial Visit)    ER follow up for hematuria    HPI: Patient is a 61 year old Caucasian male who presents today as a referral from [facility] for microscopic hematuria with his fiance, Byrd Hesselbach.    Patient was found to have microsocpic hematuria on two occassions during a recent hospitalization for sepsis due to pneumonia.   Urine culture was negative.  Patient doesn't have a prior history of microscopic hematuria.    He does not have a prior history of recurrent urinary tract infections, nephrolithiasis, trauma to the genitourinary tract, BPH or malignancies of the genitourinary tract.  He does not have a family medical history of nephrolithiasis, malignancies of the genitourinary tract or hematuria.   Today, he is having symptoms of nocturia x 3, intermittency at night and hesitancy at night.   His UA today demonstrates 3-10 RBC's.   He is not experiencing any suprapubic pain, abdominal pain or flank pain. He denies any recent fevers, chills, nausea or vomiting.   RUS performed on 11/16/2016 noted possible bilateral nephrolithiasis. No hydronephrosis. No acute findings.  I have independently reviewed the films.    He is a smoker.  He works as a Education administrator.    PMH: Past Medical History:  Diagnosis Date  . Hematuria   . Ulcer     Surgical History: Past Surgical History:  Procedure Laterality Date  . ABDOMINAL SURGERY     "burst blood vessel"  . INNER EAR SURGERY     "redid eardrum"  . stomach ulcer surgery      Home Medications:  Allergies as of 12/11/2016   No Known Allergies     Medication List       Accurate as of 12/11/16  3:02 PM. Always use your most recent med list.          acetaminophen 500 MG tablet Commonly known as:  TYLENOL Take 500 mg by mouth every 6 (six) hours as  needed.   albuterol 108 (90 Base) MCG/ACT inhaler Commonly known as:  PROVENTIL HFA;VENTOLIN HFA Inhale 2 puffs into the lungs every 6 (six) hours as needed for wheezing or shortness of breath.   azithromycin 250 MG tablet Commonly known as:  ZITHROMAX One tab daily for two more days   beclomethasone 40 MCG/ACT inhaler Commonly known as:  QVAR Inhale 1 puff into the lungs 2 (two) times daily.   nicotine 14 mg/24hr patch Commonly known as:  NICODERM CQ - dosed in mg/24 hours Place 1 patch (14 mg total) onto the skin daily.   oseltamivir 75 MG capsule Commonly known as:  TAMIFLU One tab twice a day for 5 more doses   predniSONE 5 MG tablet Commonly known as:  DELTASONE 6 tabs po day1; 4 tabs po day2; 3 tabs po day3; 2 tabs po day4; 1 tab po day5       Allergies: No Known Allergies  Family History: Family History  Problem Relation Age of Onset  . Cancer Mother   . Leukemia Father   . Diabetes Brother   . Kidney cancer Neg Hx   . Prostate cancer Neg Hx   . Bladder Cancer Neg Hx     Social History:  reports that he has been smoking.  He has never used smokeless tobacco. He reports that he drinks alcohol. He reports  that he does not use drugs.  ROS: UROLOGY Frequent Urination?: No Hard to postpone urination?: No Burning/pain with urination?: No Get up at night to urinate?: Yes Leakage of urine?: No Urine stream starts and stops?: Yes Trouble starting stream?: Yes Do you have to strain to urinate?: No Blood in urine?: No Urinary tract infection?: No Sexually transmitted disease?: No Injury to kidneys or bladder?: No Painful intercourse?: No Weak stream?: No Erection problems?: No Penile pain?: No  Gastrointestinal Nausea?: No Vomiting?: No Indigestion/heartburn?: No Diarrhea?: No Constipation?: No  Constitutional Fever: No Night sweats?: Yes Weight loss?: Yes Fatigue?: Yes  Skin Skin rash/lesions?: No Itching?: Yes  Eyes Blurred vision?:  No Double vision?: No  Ears/Nose/Throat Sore throat?: No Sinus problems?: No  Hematologic/Lymphatic Swollen glands?: No Easy bruising?: Yes  Cardiovascular Leg swelling?: No Chest pain?: No  Respiratory Cough?: Yes Shortness of breath?: Yes  Endocrine Excessive thirst?: Yes  Musculoskeletal Back pain?: Yes Joint pain?: Yes  Neurological Headaches?: Yes Dizziness?: Yes  Psychologic Depression?: No Anxiety?: No  Physical Exam: BP 111/70   Pulse (!) 101   Ht 5\' 9"  (1.753 m)   Wt 120 lb 6.4 oz (54.6 kg)   BMI 17.78 kg/m   Constitutional: Well nourished. Alert and oriented, No acute distress. HEENT: [place] AT, moist mucus membranes. Trachea midline, no masses. Cardiovascular: No clubbing, cyanosis, or edema. Respiratory: Normal respiratory effort, no increased work of breathing. GI: Abdomen is soft, non tender, non distended, no abdominal masses. Liver and spleen not palpable.  No hernias appreciated.  Stool sample for occult testing is not indicated.   GU: No CVA tenderness.  No bladder fullness or masses.  Patient with circumcised phallus.  Urethral meatus is patent.  No penile discharge. No penile lesions or rashes. Scrotum without lesions, cysts, rashes and/or edema.  Testicles are located scrotally bilaterally. No masses are appreciated in the testicles. Left and right epididymis are normal. Rectal: Patient with  normal sphincter tone. Anus and perineum without scarring or rashes. No rectal masses are appreciated. Prostate is approximately 35 grams, no nodules are appreciated. Seminal vesicles are normal. Skin: No rashes, bruises or suspicious lesions. Lymph: No cervical or inguinal adenopathy. Neurologic: Grossly intact, no focal deficits, moving all 4 extremities. Psychiatric: Normal mood and affect.  Laboratory Data: Lab Results  Component Value Date   WBC 3.3 (L) 11/17/2016   HGB 13.2 11/17/2016   HCT 37.3 (L) 11/17/2016   MCV 100.8 (H) 11/17/2016   PLT  159 11/17/2016    Lab Results  Component Value Date   CREATININE 0.53 (L) 11/17/2016     Lab Results  Component Value Date   AST 28 11/15/2016   Lab Results  Component Value Date   ALT 19 11/15/2016    Urinalysis 3-10 RBC's.  See EPIC.    Pertinent Imaging: CLINICAL DATA:  Microscopic hematuria  EXAM: RENAL / URINARY TRACT ULTRASOUND COMPLETE  COMPARISON:  09/29/2010 CT  FINDINGS: Right Kidney:  Length: 11.6 cm possible small shadowing nonobstructing left lower pole stone measuring 5 mm. Echogenicity within normal limits. No mass or hydronephrosis visualized.  Left Kidney:  Length: 12 cm. Possible small shadowing nonobstructing stone in the midpole measuring 4 mm. Echogenicity within normal limits. No mass or hydronephrosis visualized.  Bladder:  Appears normal for degree of bladder distention.  IMPRESSION: Possible bilateral nephrolithiasis. No hydronephrosis. No acute findings.   Electronically Signed   By: Charlett Nose M.D.   On: 11/16/2016 10:09  Assessment & Plan:  1. Microscopic hematuria  - I explained to the patient that there are a number of causes that can be associated with blood in the urine, such as stones,  BPH, UTI's, damage to the urinary tract and/or cancer.  - At this time, I felt that the patient warranted further urologic evaluation.   The AUA guidelines state that a CT urogram is the preferred imaging study to evaluate hematuria.  - I explained to the patient that a contrast material will be injected into a vein and that in rare instances, an allergic reaction can result and may even life threatening   The patient denies any allergies to contrast, iodine and/or seafood and is not taking metformin  - Following the imaging study,  I've recommended a cystoscopy. I described how this is performed, typically in an office setting with a flexible cystoscope. We described the risks, benefits, and possible side effects, the most  common of which is a minor amount of blood in the urine and/or burning which usually resolves in 24 to 48 hours.    - The patient had the opportunity to ask questions which were answered. Based upon this discussion, the patient is willing to proceed. Therefore, I've ordered: a CT Urogram and cystoscopy.  - The patient will return following all of the above for discussion of the results.   - UA  - Urine culture  - BUN + creatinine    Return for CT Urogram report and cystoscopy.  These notes generated with voice recognition software. I apologize for typographical errors.  Michiel CowboySHANNON Markeshia Giebel, PA-C  [facility] [address], [address] [place], [place] [address] [phone]

## 2016-12-11 ENCOUNTER — Encounter: Payer: Self-pay | Admitting: Urology

## 2016-12-11 ENCOUNTER — Ambulatory Visit (INDEPENDENT_AMBULATORY_CARE_PROVIDER_SITE_OTHER): Payer: Self-pay | Admitting: Urology

## 2016-12-11 VITALS — BP 111/70 | HR 101 | Ht 69.0 in | Wt 120.4 lb

## 2016-12-11 DIAGNOSIS — R3129 Other microscopic hematuria: Secondary | ICD-10-CM

## 2016-12-11 NOTE — Patient Instructions (Addendum)
Hematuria, Adult Hematuria is blood in your urine. It can be caused by a bladder infection, kidney infection, prostate infection, kidney stone, or cancer of your urinary tract. Infections can usually be treated with medicine, and a kidney stone usually will pass through your urine. If neither of these is the cause of your hematuria, further workup to find out the reason may be needed. It is very important that you tell your health care provider about any blood you see in your urine, even if the blood stops without treatment or happens without causing pain. Blood in your urine that happens and then stops and then happens again can be a symptom of a very serious condition. Also, pain is not a symptom in the initial stages of many urinary cancers. Follow these instructions at home:  Drink lots of fluid, 3-4 quarts a day. If you have been diagnosed with an infection, cranberry juice is especially recommended, in addition to large amounts of water.  Avoid caffeine, tea, and carbonated beverages because they tend to irritate the bladder.  Avoid alcohol because it may irritate the prostate.  Take all medicines as directed by your health care provider.  If you were prescribed an antibiotic medicine, finish it all even if you start to feel better.  If you have been diagnosed with a kidney stone, follow your health care provider's instructions regarding straining your urine to catch the stone.  Empty your bladder often. Avoid holding urine for long periods of time.  After a bowel movement, women should cleanse front to back. Use each tissue only once.  Empty your bladder before and after sexual intercourse if you are a male. Contact a health care provider if:  You develop back pain.  You have a fever.  You have a feeling of sickness in your stomach (nausea) or vomiting.  Your symptoms are not better in 3 days. Return sooner if you are getting worse. Get help right away if:  You develop  severe vomiting and are unable to keep the medicine down.  You develop severe back or abdominal pain despite taking your medicines.  You begin passing a large amount of blood or clots in your urine.  You feel extremely weak or faint, or you pass out. This information is not intended to replace advice given to you by your health care provider. Make sure you discuss any questions you have with your health care provider. Document Released: 07/23/2005 Document Revised: 12/29/2015 Document Reviewed: 03/23/2013 Elsevier Interactive Patient Education  2017 [employer]  CT Scan A computed tomography (CT) scan is a specialized X-ray scan. It uses X-rays and a computer to make pictures of different areas of your body. A CT scan can offer more detailed information than a regular X-ray exam. The CT scan provides data about internal organs, soft tissue structures, blood vessels, and bones. The CT scanner is a large machine that takes pictures of your body as you move through the opening. Tell a health care provider about:  Any allergies you have.  All medicines you are taking, including vitamins, herbs, eye drops, creams, and over-the-counter medicines.  Any problems you or family members have had with anesthetic medicines.  Any blood disorders you have.  Any surgeries you have had.  Any medical conditions you have. What are the risks? Generally, this is a safe procedure. However, as with any procedure, problems can occur. Possible problems include:  An allergic reaction to the contrast material.  Development of cancer from excessive exposure to   radiation. The risk of this is small.  What happens before the procedure?  The day before the test, stop drinking caffeinated beverages. These include energy drinks, tea, soda, coffee, and hot chocolate.  On the day of the test: ? About 4 hours before the test, stop eating and drinking anything but water as advised by your health care  provider. ? Avoid wearing jewelry. You will have to partly or fully undress and wear a hospital gown. What happens during the procedure?  You will be asked to lie on a table with your arms above your head.  If contrast dye is to be used for the test, an IV tube will be inserted in your arm. The contrast dye will be injected into the IV tube. You might feel warm, or you may get a metallic taste in your mouth.  The table you will be lying on will move into a large machine that will do the scanning.  You will be able to see, hear, and talk to the person running the machine while you are in it. Follow that person's directions.  The CT machine will move around you to take pictures. Do not move while it is scanning. This helps to get a good image.  When the best possible pictures have been taken, the machine will be turned off. The table will be moved out of the machine. The IV tube will then be removed. What happens after the procedure? Ask your health care provider when to follow up for your test results. This information is not intended to replace advice given to you by your health care provider. Make sure you discuss any questions you have with your health care provider. Document Released: 08/30/2004 Document Revised: 12/29/2015 Document Reviewed: 03/30/2013 Elsevier Interactive Patient Education  2017 [employer] Inc.  Cystoscopy Cystoscopy is a procedure that is used to help diagnose and sometimes treat conditions that affect that lower urinary tract. The lower urinary tract includes the bladder and the tube that drains urine from the bladder out of the body (urethra). Cystoscopy is performed with a thin, tube-shaped instrument with a light and camera at the end (cystoscope). The cystoscope may be hard (rigid) or flexible, depending on the goal of the procedure.The cystoscope is inserted through the urethra, into the bladder. Cystoscopy may be recommended if you have:  Urinary tractinfections  that keep coming back (recurring).  Blood in the urine (hematuria).  Loss of bladder control (urinary incontinence) or an overactive bladder.  Unusual cells found in a urine sample.  A blockage in the urethra.  Painful urination.  An abnormality in the bladder found during an intravenous pyelogram (IVP) or CT scan.  Cystoscopy may also be done to remove a sample of tissue to be examined under a microscope (biopsy). Tell a health care provider about:  Any allergies you have.  All medicines you are taking, including vitamins, herbs, eye drops, creams, and over-the-counter medicines.  Any problems you or family members have had with anesthetic medicines.  Any blood disorders you have.  Any surgeries you have had.  Any medical conditions you have.  Whether you are pregnant or may be pregnant. What are the risks? Generally, this is a safe procedure. However, problems may occur, including:  Infection.  Bleeding.  Allergic reactions to medicines.  Damage to other structures or organs.  What happens before the procedure?  Ask your health care provider about: ? Changing or stopping your regular medicines. This is especially important if you are taking   diabetes medicines or blood thinners. ? Taking medicines such as aspirin and ibuprofen. These medicines can thin your blood. Do not take these medicines before your procedure if your health care provider instructs you not to.  Follow instructions from your health care provider about eating or drinking restrictions.  You may be given antibiotic medicine to help prevent infection.  You may have an exam or testing, such as X-rays of the bladder, urethra, or kidneys.  You may have urine tests to check for signs of infection.  Plan to have someone take you home after the procedure. What happens during the procedure?  To reduce your risk of infection,your health care team will wash or sanitize their hands.  You will be  given one or more of the following: ? A medicine to help you relax (sedative). ? A medicine to numb the area (local anesthetic).  The area around the opening of your urethra will be cleaned.  The cystoscope will be passed through your urethra into your bladder.  Germ-free (sterile)fluid will flow through the cystoscope to fill your bladder. The fluid will stretch your bladder so that your surgeon can clearly examine your bladder walls.  The cystoscope will be removed and your bladder will be emptied. The procedure may vary among health care providers and hospitals. What happens after the procedure?  You may have some soreness or pain in your abdomen and urethra. Medicines will be available to help you.  You may have some blood in your urine.  Do not drive for 24 hours if you received a sedative. This information is not intended to replace advice given to you by your health care provider. Make sure you discuss any questions you have with your health care provider. Document Released: 07/20/2000 Document Revised: 12/01/2015 Document Reviewed: 06/09/2015 Elsevier Interactive Patient Education  2017 Elsevier Inc.  

## 2016-12-12 LAB — MICROSCOPIC EXAMINATION: EPITHELIAL CELLS (NON RENAL): NONE SEEN /HPF (ref 0–10)

## 2016-12-12 LAB — URINALYSIS, COMPLETE
Bilirubin, UA: NEGATIVE
GLUCOSE, UA: NEGATIVE
LEUKOCYTES UA: NEGATIVE
Nitrite, UA: NEGATIVE
PH UA: 5 (ref 5.0–7.5)
PROTEIN UA: NEGATIVE
Specific Gravity, UA: 1.025 (ref 1.005–1.030)
UUROB: 0.2 mg/dL (ref 0.2–1.0)

## 2016-12-12 LAB — BUN+CREAT
BUN / CREAT RATIO: 16 (ref 9–20)
BUN: 12 mg/dL (ref 6–24)
Creatinine, Ser: 0.75 mg/dL — ABNORMAL LOW (ref 0.76–1.27)
GFR calc non Af Amer: 101 mL/min/{1.73_m2} (ref >59)
GFR, EST AFRICAN AMERICAN: 117 mL/min/{1.73_m2} (ref >59)

## 2016-12-13 LAB — CULTURE, URINE COMPREHENSIVE

## 2016-12-25 ENCOUNTER — Ambulatory Visit
Admission: RE | Admit: 2016-12-25 | Discharge: 2016-12-25 | Disposition: A | Discharge status: Home / Self Care | Payer: Self-pay | Source: Ambulatory Visit | Attending: Urology | Admitting: Urology

## 2016-12-25 DIAGNOSIS — I2510 Atherosclerotic heart disease of native coronary artery without angina pectoris: Secondary | ICD-10-CM | POA: Insufficient documentation

## 2016-12-25 DIAGNOSIS — I700 Atherosclerosis of aorta: Secondary | ICD-10-CM | POA: Insufficient documentation

## 2016-12-25 DIAGNOSIS — K5730 Diverticulosis of large intestine without perforation or abscess without bleeding: Secondary | ICD-10-CM | POA: Insufficient documentation

## 2016-12-25 DIAGNOSIS — R3129 Other microscopic hematuria: Secondary | ICD-10-CM | POA: Insufficient documentation

## 2016-12-25 MED ORDER — IOPAMIDOL (ISOVUE-300) INJECTION 61%
125.0000 mL | Freq: Once | INTRAVENOUS | Status: AC | PRN
  Administered 2016-12-25: 125 mL via INTRAVENOUS

## 2016-12-26 ENCOUNTER — Encounter: Payer: Self-pay | Admitting: Urology

## 2016-12-26 ENCOUNTER — Ambulatory Visit (INDEPENDENT_AMBULATORY_CARE_PROVIDER_SITE_OTHER): Payer: Self-pay | Admitting: Urology

## 2016-12-26 VITALS — BP 105/64 | HR 93 | Ht 69.0 in | Wt 122.1 lb

## 2016-12-26 DIAGNOSIS — R3129 Other microscopic hematuria: Secondary | ICD-10-CM

## 2016-12-26 LAB — URINALYSIS, COMPLETE
BILIRUBIN UA: NEGATIVE
Glucose, UA: NEGATIVE
KETONES UA: NEGATIVE
Leukocytes, UA: NEGATIVE
NITRITE UA: NEGATIVE
Protein, UA: NEGATIVE
Urobilinogen, Ur: 0.2 mg/dL (ref 0.2–1.0)
pH, UA: 6 (ref 5.0–7.5)

## 2016-12-26 LAB — MICROSCOPIC EXAMINATION
Bacteria, UA: NONE SEEN
EPITHELIAL CELLS (NON RENAL): NONE SEEN /HPF (ref 0–10)
WBC UA: NONE SEEN /HPF (ref 0–?)

## 2016-12-26 MED ORDER — CIPROFLOXACIN HCL 500 MG PO TABS
500.0000 mg | ORAL_TABLET | Freq: Once | ORAL | Status: AC
  Administered 2016-12-26: 500 mg via ORAL

## 2016-12-26 MED ORDER — LIDOCAINE HCL 2 % EX GEL
1.0000 "application " | Freq: Once | CUTANEOUS | Status: AC
  Administered 2016-12-26: 1 via URETHRAL

## 2016-12-26 NOTE — Progress Notes (Signed)
   12/26/16  CC:  Chief Complaint  Patient presents with  . Cysto    HPI: F/u MH eval. CT 12/25/2016 was benign. I reviewed the images. He is here for cystoscopy.   Blood pressure 105/64, pulse 93, height 5\' 9"  (1.753 m), weight 55.4 kg (122 lb 1.6 oz). NED. A&Ox3.   No respiratory distress   Abd soft, NT, ND Normal phallus with bilateral descended testicles  Cystoscopy Procedure Note  Patient identification was confirmed, informed consent was obtained, and patient was prepped using Betadine solution.  Lidocaine jelly was administered per urethral meatus.    Preoperative abx where received prior to procedure.     Pre-Procedure: - Inspection reveals a normal caliber ureteral meatus.  Procedure: The flexible cystoscope was introduced without difficulty - No urethral strictures/lesions are present. - short, non-obstructive - normal bladder neck - Bilateral ureteral orifices identified - Bladder mucosa  reveals no ulcers, tumors, or lesions - No bladder stones - No trabeculation  Retroflexion was normal.    Post-Procedure: - Patient tolerated the procedure well  Assessment/ Plan:  MH - benign eval. Per AUA Guidelines, we will see back in 1 year for H&P, UA and sooner if any issues.

## 2017-08-05 ENCOUNTER — Emergency Department
Admission: EM | Admit: 2017-08-05 | Discharge: 2017-08-05 | Disposition: A | Discharge status: Home / Self Care | Payer: Self-pay | Attending: Student in an Organized Health Care Education/Training Program | Admitting: Student in an Organized Health Care Education/Training Program

## 2017-08-05 ENCOUNTER — Emergency Department: Payer: Self-pay

## 2017-08-05 ENCOUNTER — Other Ambulatory Visit: Payer: Self-pay

## 2017-08-05 DIAGNOSIS — F17200 Nicotine dependence, unspecified, uncomplicated: Secondary | ICD-10-CM | POA: Insufficient documentation

## 2017-08-05 DIAGNOSIS — R002 Palpitations: Secondary | ICD-10-CM | POA: Insufficient documentation

## 2017-08-05 LAB — BASIC METABOLIC PANEL
ANION GAP: 8 (ref 5–15)
BUN: 14 mg/dL (ref 6–20)
CO2: 26 mmol/L (ref 22–32)
Calcium: 9 mg/dL (ref 8.9–10.3)
Chloride: 102 mmol/L (ref 101–111)
Creatinine, Ser: 0.7 mg/dL (ref 0.61–1.24)
GFR calc Af Amer: >60 mL/min (ref >60)
GFR calc non Af Amer: >60 mL/min (ref >60)
GLUCOSE: 106 mg/dL — AB (ref 65–99)
POTASSIUM: 4.3 mmol/L (ref 3.5–5.1)
Sodium: 136 mmol/L (ref 135–145)

## 2017-08-05 LAB — CBC
HEMATOCRIT: 43.8 % (ref 40.0–52.0)
HEMOGLOBIN: 14.9 g/dL (ref 13.0–18.0)
MCH: 35.7 pg — AB (ref 26.0–34.0)
MCHC: 34 g/dL (ref 32.0–36.0)
MCV: 104.9 fL — AB (ref 80.0–100.0)
Platelets: 331 10*3/uL (ref 150–440)
RBC: 4.17 MIL/uL — ABNORMAL LOW (ref 4.40–5.90)
RDW: 12.8 % (ref 11.5–14.5)
WBC: 7.5 10*3/uL (ref 3.8–10.6)

## 2017-08-05 LAB — TROPONIN I: Troponin I: <0.03 ng/mL (ref <0.03)

## 2017-08-05 NOTE — ED Notes (Signed)
Pt reports episodes of his heart "having to pump hard" generally after he eats. He states that it has been occurring for a year, but that it has increased in severity and frequency in the last week. Pt states he feels "funny" when the episodes occur. He Is concerned about his bp being high. PT alert & oriented with NAD noted.

## 2017-08-05 NOTE — ED Notes (Signed)
Pt discharged home after verbalizing understanding of discharge instructions; nad noted. 

## 2017-08-05 NOTE — ED Triage Notes (Signed)
Pt c/o intermittent HA with feeling like his heart is racing or skipping beats for the past week with some SOB.the patient is in NAD on arrival. Respirations WNL, skin is warm and dry.

## 2017-08-05 NOTE — ED Provider Notes (Signed)
[facility] Emergency Department Provider Note    First MD Initiated Contact with Patient 08/05/17 0940     (approximate)  I have reviewed the triage vital signs and the nursing notes.   HISTORY  Chief Complaint Headache and Tachycardia    HPI [name] is a 61 y.o. male presents to the ER with chief complaint of palpitations and his heart is been occurring for over one year.  States he will wake him up in the middle the night feels that his heart is skipping a beat.  Patient states that he knows it also after drinking water or having something to eat.  Denies any shortness of breath.  States he has had intermittent mild headaches and is concerned that his blood pressure is elevated.  States he is also been under quite a deal of stress recently trying to make payments on house bills.  States he feels that his face is warm.  No fevers.  No numbness or tingling.  No previous history of heart disease.  Does smoke.  Past Medical History:  Diagnosis Date  . Hematuria   . Ulcer    Family History  Problem Relation Age of Onset  . Cancer Mother   . Leukemia Father   . Diabetes Brother   . Kidney cancer Neg Hx   . Prostate cancer Neg Hx   . Bladder Cancer Neg Hx    Past Surgical History:  Procedure Laterality Date  . ABDOMINAL SURGERY     "burst blood vessel"  . INNER EAR SURGERY     "redid eardrum"  . stomach ulcer surgery     Patient Active Problem List   Diagnosis Date Noted  . Sepsis (HCC) 11/15/2016      Prior to Admission medications   Medication Sig Start Date End Date Taking? Authorizing Provider  acetaminophen (TYLENOL) 500 MG tablet Take 500 mg by mouth every 6 (six) hours as needed.    [provider]  albuterol (PROVENTIL HFA;VENTOLIN HFA) 108 (90 Base) MCG/ACT inhaler Inhale 2 puffs into the lungs every 6 (six) hours as needed for wheezing or shortness of breath. 11/17/16   Alford HighlandWieting, Richard, MD  beclomethasone (QVAR) 40  MCG/ACT inhaler Inhale 1 puff into the lungs 2 (two) times daily. 11/17/16   Alford HighlandWieting, Richard, MD  nicotine (NICODERM CQ - DOSED IN MG/24 HOURS) 14 mg/24hr patch Place 1 patch (14 mg total) onto the skin daily. Patient not taking: Reported on 12/26/2016 11/17/16   Alford HighlandWieting, Richard, MD    Allergies Patient has no known allergies.    Social History Social History   Tobacco Use  . Smoking status: Current Some Day Smoker  . Smokeless tobacco: Never Used  . Tobacco comment: on the patch  Substance Use Topics  . Alcohol use: Yes    Comment: quit April 12th 2018  . Drug use: No    Review of Systems Patient denies headaches, rhinorrhea, blurry vision, numbness, shortness of breath, chest pain, edema, cough, abdominal pain, nausea, vomiting, diarrhea, dysuria, fevers, rashes or hallucinations unless otherwise stated above in HPI. ____________________________________________   PHYSICAL EXAM:  VITAL SIGNS: Vitals:   08/05/17 0912 08/05/17 0959  BP: 123/79 117/75  Pulse: 99 85  Resp: 17 17  Temp: 98.3 F (36.8 C)   SpO2: 99% 99%    Constitutional: Alert and oriented. Well appearing and in no acute distress. Eyes: Conjunctivae are normal.  Head: Atraumatic. Nose: No congestion/rhinnorhea. Mouth/Throat: Mucous membranes are moist.  Neck: No stridor. Painless ROM.  Cardiovascular: Normal rate, regular rhythm. Grossly normal heart sounds.  Good peripheral circulation. Respiratory: Normal respiratory effort.  No retractions. Lungs CTAB. Gastrointestinal: Soft and nontender. No distention. No abdominal bruits. No CVA tenderness. Genitourinary:  Musculoskeletal: No lower extremity tenderness nor edema.  No joint effusions. Neurologic:  CN- intact.  No facial droop, Normal FNF.  Normal heel to shin.  Sensation intact bilaterally. Normal speech and language. No gross focal neurologic deficits are appreciated. No gait instability. Skin:  Skin is warm, dry and intact. No rash  noted. Psychiatric: Mood and affect are normal. Speech and behavior are normal.  ____________________________________________   LABS (all labs ordered are listed, but only abnormal results are displayed)  Results for orders placed or performed during the hospital encounter of 08/05/17 (from the past 24 hour(s))  Basic metabolic panel     Status: Abnormal   Collection Time: 08/05/17  9:16 AM  Result Value Ref Range   Sodium 136 135 - 145 mmol/L   Potassium 4.3 3.5 - 5.1 mmol/L   Chloride 102 101 - 111 mmol/L   CO2 26 22 - 32 mmol/L   Glucose, Bld 106 (H) 65 - 99 mg/dL   BUN 14 6 - 20 mg/dL   Creatinine, Ser 1.61 0.61 - 1.24 mg/dL   Calcium 9.0 8.9 - 09.6 mg/dL   GFR calc non Af Amer >60 >60 mL/min   GFR calc Af Amer >60 >60 mL/min   Anion gap 8 5 - 15  CBC     Status: Abnormal   Collection Time: 08/05/17  9:16 AM  Result Value Ref Range   WBC 7.5 3.8 - 10.6 K/uL   RBC 4.17 (L) 4.40 - 5.90 MIL/uL   Hemoglobin 14.9 13.0 - 18.0 g/dL   HCT 04.5 40.9 - 81.1 %   MCV 104.9 (H) 80.0 - 100.0 fL   MCH 35.7 (H) 26.0 - 34.0 pg   MCHC 34.0 32.0 - 36.0 g/dL   RDW 91.4 78.2 - 95.6 %   Platelets 331 150 - 440 K/uL  Troponin I     Status: None   Collection Time: 08/05/17  9:24 AM  Result Value Ref Range   Troponin I <0.03 <0.03 ng/mL   ____________________________________________  EKG My review and personal interpretation at Time: 9:17   Indication: palpitations  Rate: 95  Rhythm: sinus Axis: right Other: normal intervals, no stemi, nonspeicific t wave changes, no evidence of acute ischemia ____________________________________________  RADIOLOGY  I personally reviewed all radiographic images ordered to evaluate for the above acute complaints and reviewed radiology reports and findings.  These findings were personally discussed with the patient.  Please see medical record for radiology report.  ____________________________________________   PROCEDURES  Procedure(s) performed:   Procedures    Critical Care performed: no ____________________________________________   INITIAL IMPRESSION / ASSESSMENT AND PLAN / ED COURSE  Pertinent labs & imaging results that were available during my care of the patient were reviewed by me and considered in my medical decision making (see chart for details).  DDX: ACS, pericarditis,, dysrhythmia esophagitis, boerhaaves, pe, dissection, pna, bronchitis, costochondritis   [name] is a 61 y.o. who presents to the ED with symptoms as described above.  Patient is well-appearing he is hemodynamically stable and normotensive.  EKG shows no dysrhythmia or acute ischemic changes.  Patient with a heart score of 3.  Symptoms ongoing for several months.  This is not clinically consistent with ACS.  He has  no focal neuro deficits.  Blood work is reassuring.  Chest x-ray shows probable component of underlying COPD but the patient has no respiratory symptoms.  Patient will be given referral to cardiology as he may be having some component of intermittent dysrhythmia.  His abdominal exam is soft and benign.  At this point I do believe patient stable for outpatient referral.      ____________________________________________   FINAL CLINICAL IMPRESSION(S) / ED DIAGNOSES  Final diagnoses:  Palpitations  Smoking addiction      NEW MEDICATIONS STARTED DURING THIS VISIT:  This SmartLink is deprecated. Use AVSMEDLIST instead to display the medication list for a patient.   Note:  This document was prepared using Dragon voice recognition software and may include unintentional dictation errors.    Willy Eddyobinson, Brien Lowe, MD 08/05/17 [phone]

## 2017-08-05 NOTE — ED Notes (Signed)
Pt given snack and drink and remote control.

## 2017-12-26 ENCOUNTER — Ambulatory Visit: Payer: Self-pay | Admitting: Urology

## 2017-12-31 ENCOUNTER — Ambulatory Visit: Payer: Self-pay | Admitting: Urology

## 2018-01-05 NOTE — Progress Notes (Deleted)
01/06/2018 3:02 PM   Karoline Caldwell Oct 16, 1955 [id number]  Referring provider: No referring provider defined for this encounter.  No chief complaint on file.   HPI: Patient is a 62 year old Caucasian male with a history of hematuria who presents today for a yearly follow-up.  Background history Patient is a 62 year old Caucasian male who presents today as a referral from [facility] for microscopic hematuria with his fiance, Byrd Hesselbach.  Patient was found to have microsocpic hematuria on two occassions during a recent hospitalization for sepsis due to pneumonia.   Urine culture was negative.  Patient doesn't have a prior history of microscopic hematuria.  He does not have a prior history of recurrent urinary tract infections, nephrolithiasis, trauma to the genitourinary tract, BPH or malignancies of the genitourinary tract.  He does not have a family medical history of nephrolithiasis, malignancies of the genitourinary tract or hematuria.  Today, he is having symptoms of nocturia x 3, intermittency at night and hesitancy at night.   His UA demonstrates 3-10 RBC's.   He is not experiencing any suprapubic pain, abdominal pain or flank pain. He denies any recent fevers, chills, nausea or vomiting.  RUS performed on 11/16/2016 noted possible bilateral nephrolithiasis. No hydronephrosis. No acute findings.  He is a smoker.  He works as a Education administrator.   CTU on 12/25/2016 found no etiology for his hematuria.  Cystoscopy on 12/26/2016 with Dr. Mena Goes was negative.    He has not experienced any gross hematuria over the last year.  His UA today is ***.     PMH: Past Medical History:  Diagnosis Date  . Hematuria   . Ulcer     Surgical History: Past Surgical History:  Procedure Laterality Date  . ABDOMINAL SURGERY     "burst blood vessel"  . INNER EAR SURGERY     "redid eardrum"  . stomach ulcer surgery      Home Medications:  Allergies as of 01/06/2018   No Known Allergies     Medication List          Accurate as of 01/05/18  3:02 PM. Always use your most recent med list.          acetaminophen 500 MG tablet Commonly known as:  TYLENOL Take 500 mg by mouth every 6 (six) hours as needed.   albuterol 108 (90 Base) MCG/ACT inhaler Commonly known as:  PROVENTIL HFA;VENTOLIN HFA Inhale 2 puffs into the lungs every 6 (six) hours as needed for wheezing or shortness of breath.   beclomethasone 40 MCG/ACT inhaler Commonly known as:  QVAR Inhale 1 puff into the lungs 2 (two) times daily.   nicotine 14 mg/24hr patch Commonly known as:  NICODERM CQ - dosed in mg/24 hours Place 1 patch (14 mg total) onto the skin daily.       Allergies: No Known Allergies  Family History: Family History  Problem Relation Age of Onset  . Cancer Mother   . Leukemia Father   . Diabetes Brother   . Kidney cancer Neg Hx   . Prostate cancer Neg Hx   . Bladder Cancer Neg Hx     Social History:  reports that he has been smoking.  He has never used smokeless tobacco. He reports that he drinks alcohol. He reports that he does not use drugs.  ROS:  Physical Exam: There were no vitals taken for this visit.  Constitutional: Well nourished. Alert and oriented, No acute distress. HEENT: [place] AT, moist mucus membranes. Trachea midline, no masses. Cardiovascular: No clubbing, cyanosis, or edema. Respiratory: Normal respiratory effort, no increased work of breathing. GI: Abdomen is soft, non tender, non distended, no abdominal masses. Liver and spleen not palpable.  No hernias appreciated.  Stool sample for occult testing is not indicated.   GU: No CVA tenderness.  No bladder fullness or masses.  Patient with circumcised phallus.  Urethral meatus is patent.  No penile discharge. No penile lesions or rashes. Scrotum without lesions, cysts, rashes and/or edema.  Testicles are located scrotally bilaterally. No masses are appreciated in the  testicles. Left and right epididymis are normal. Rectal: Patient with  normal sphincter tone. Anus and perineum without scarring or rashes. No rectal masses are appreciated. Prostate is approximately 35 grams, no nodules are appreciated. Seminal vesicles are normal. Skin: No rashes, bruises or suspicious lesions. Lymph: No cervical or inguinal adenopathy. Neurologic: Grossly intact, no focal deficits, moving all 4 extremities. Psychiatric: Normal mood and affect. ***  Constitutional: Well nourished. Alert and oriented, No acute distress. HEENT: [place] AT, moist mucus membranes. Trachea midline, no masses. Cardiovascular: No clubbing, cyanosis, or edema. Respiratory: Normal respiratory effort, no increased work of breathing. GI: Abdomen is soft, non tender, non distended, no abdominal masses. Liver and spleen not palpable.  No hernias appreciated.  Stool sample for occult testing is not indicated.   GU: No CVA tenderness.  No bladder fullness or masses.  Patient with circumcised/uncircumcised phallus. ***Foreskin easily retracted***  Urethral meatus is patent.  No penile discharge. No penile lesions or rashes. Scrotum without lesions, cysts, rashes and/or edema.  Testicles are located scrotally bilaterally. No masses are appreciated in the testicles. Left and right epididymis are normal. Rectal: Patient with  normal sphincter tone. Anus and perineum without scarring or rashes. No rectal masses are appreciated. Prostate is approximately *** grams, *** nodules are appreciated. Seminal vesicles are normal. Skin: No rashes, bruises or suspicious lesions. Lymph: No cervical or inguinal adenopathy. Neurologic: Grossly intact, no focal deficits, moving all 4 extremities. Psychiatric: Normal mood and affect.   Laboratory Data: Lab Results  Component Value Date   WBC 7.5 08/05/2017   HGB 14.9 08/05/2017   HCT 43.8 08/05/2017   MCV 104.9 (H) 08/05/2017   PLT 331 08/05/2017    Lab Results  Component  Value Date   CREATININE 0.70 08/05/2017     Lab Results  Component Value Date   AST 28 11/15/2016   Lab Results  Component Value Date   ALT 19 11/15/2016    Urinalysis *** See EPIC.    I have reviewed the labs.  Assessment & Plan:    1. History of hematuria CTU and cystoscopy in May 2018 were negative He does not report any gross hematuria UA *** Return in 1 year for UA and return sooner for gross hematuria  2. PSA screening Discussed with the patient that the AUA panel feels that in men age 60 to 5269 years, there was sufficient certainty that the benefits of screening could outweigh the harms - at this time he would like to proceed/continue with screening *** PSA drawn today ***       No follow-ups on file.  These notes generated with voice recognition software. I apologize for typographical errors.  Michiel CowboySHANNON Ahmad Vanwey, PA-C  [facility] [address] [place], [place]  [address] [phone]  

## 2018-01-06 ENCOUNTER — Ambulatory Visit: Payer: Self-pay | Admitting: Urology

## 2018-01-06 ENCOUNTER — Encounter: Payer: Self-pay | Admitting: Urology

## 2018-05-19 IMAGING — CR DG CHEST 2V
2 series · 2 of 2 positions shown · non-contrast
Comparison: 03/28/2011 chest radiograph.

CLINICAL DATA: Cough.  Hypoxia.

EXAM:
CHEST  2 VIEW

[chest pa]
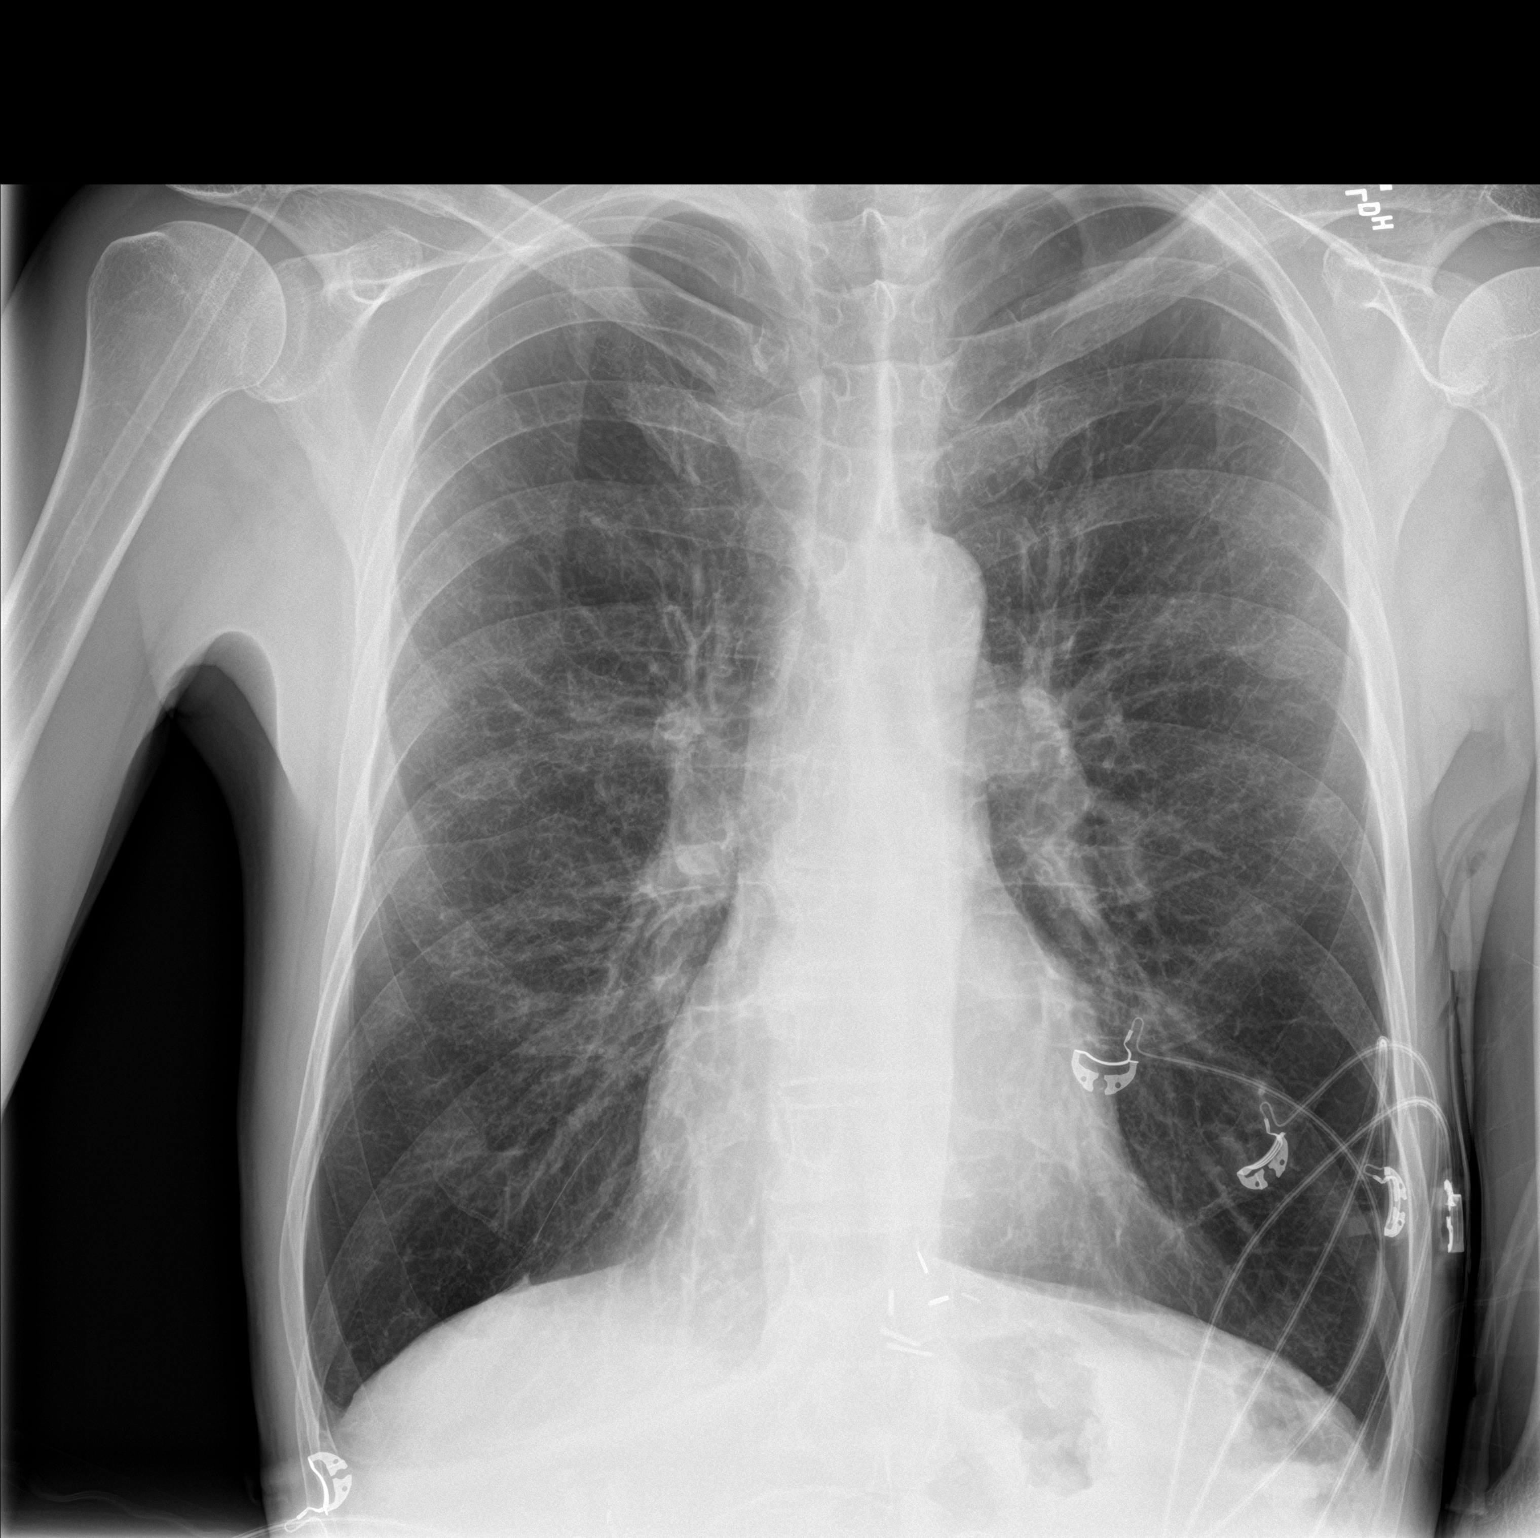

[chest lat]
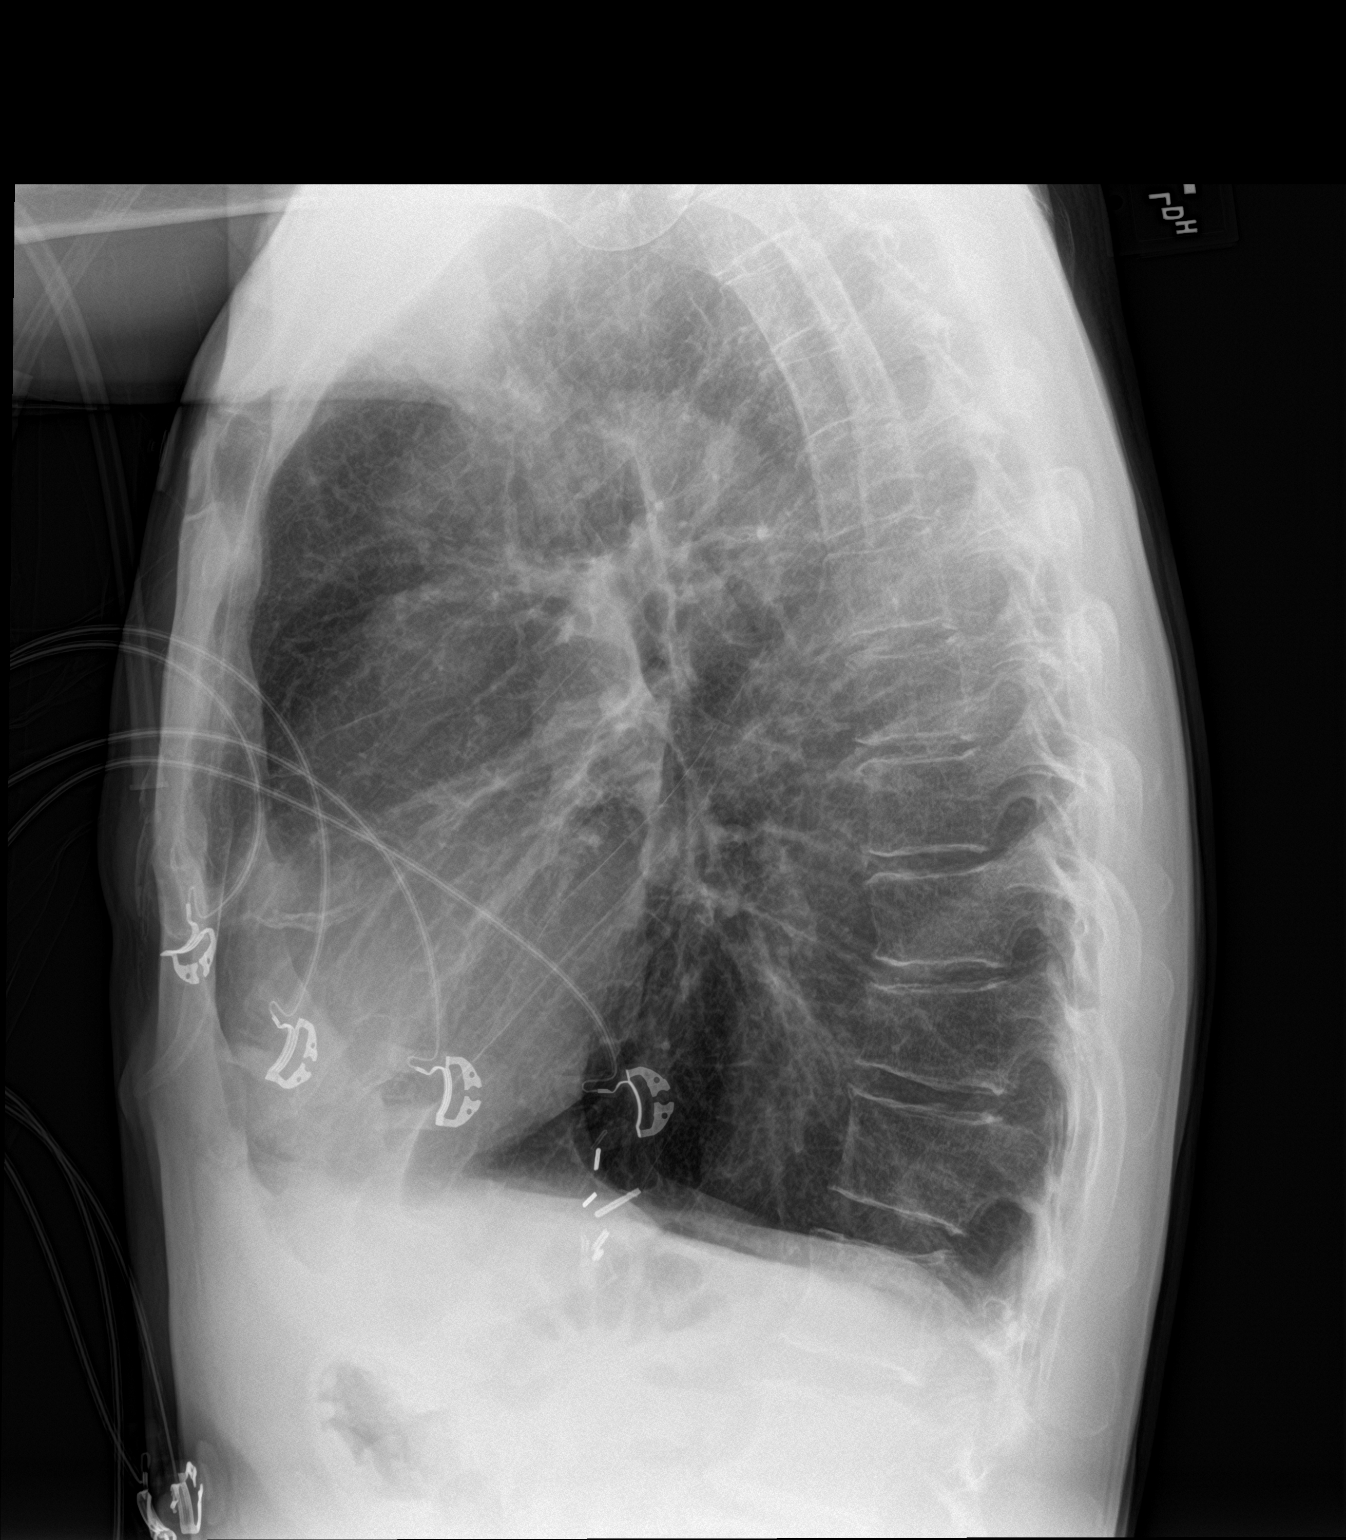

[2 of 2 positions shown; findings below may reference images not displayed]

FINDINGS: Surgical clips overlie the esophagogastric junction region. Stable
cardiomediastinal silhouette with normal heart size and aortic
atherosclerosis. No pneumothorax. No pleural effusion. Hyperinflated
lungs. No pulmonary edema. No acute consolidative airspace disease.
IMPRESSION: Hyperinflated lungs, suggesting COPD. Otherwise no acute pulmonary
disease. Aortic atherosclerosis.

## 2019-03-12 ENCOUNTER — Telehealth: Payer: Self-pay | Admitting: *Deleted

## 2019-03-12 NOTE — Telephone Encounter (Signed)
Previously discussed lung screening referral with patient and he requested specific date range. Multiple attempts have been made to contact him with a date and time for lung screening appointment. Unfortunately he has not answered or returned voicemails.

## 2019-03-13 ENCOUNTER — Telehealth: Payer: Self-pay | Admitting: *Deleted

## 2019-03-13 DIAGNOSIS — Z87891 Personal history of nicotine dependence: Secondary | ICD-10-CM

## 2019-03-13 DIAGNOSIS — Z122 Encounter for screening for malignant neoplasm of respiratory organs: Secondary | ICD-10-CM

## 2019-03-13 NOTE — Telephone Encounter (Signed)
Received referral for initial lung cancer screening scan. Contacted patient and obtained smoking history,(current, 40 pack year) as well as answering questions related to screening process. Patient denies signs of lung cancer such as weight loss or hemoptysis. Patient denies comorbidity that would prevent curative treatment if lung cancer were found. Patient is scheduled for shared decision making visit and CT scan on 03/27/19 at 845am.

## 2019-03-27 ENCOUNTER — Other Ambulatory Visit: Payer: Self-pay

## 2019-03-27 ENCOUNTER — Encounter: Payer: Self-pay | Admitting: Hospice and Palliative Medicine

## 2019-03-27 ENCOUNTER — Inpatient Hospital Stay: Payer: Self-pay | Attending: Nurse Practitioner | Admitting: Hospice and Palliative Medicine

## 2019-03-27 ENCOUNTER — Ambulatory Visit
Admission: RE | Admit: 2019-03-27 | Discharge: 2019-03-27 | Disposition: A | Discharge status: Home / Self Care | Payer: Self-pay | Source: Ambulatory Visit | Attending: Nurse Practitioner | Admitting: Nurse Practitioner

## 2019-03-27 DIAGNOSIS — Z122 Encounter for screening for malignant neoplasm of respiratory organs: Secondary | ICD-10-CM

## 2019-03-27 DIAGNOSIS — Z87891 Personal history of nicotine dependence: Secondary | ICD-10-CM | POA: Insufficient documentation

## 2019-03-27 NOTE — Progress Notes (Signed)
In accordance with CMS guidelines, patient has met eligibility criteria including age, absence of signs or symptoms of lung cancer.  Social History   Tobacco Use  . Smoking status: Current Every Day Smoker    Packs/day: 1.00    Years: 40.00    Pack years: 40.00    Types: Cigarettes  . Smokeless tobacco: Never Used  . Tobacco comment: on the patch  Substance Use Topics  . Alcohol use: Yes    Comment: quit April 12th 2018  . Drug use: No      A shared decision-making session was conducted prior to the performance of CT scan. This includes one or more decision aids, includes benefits and harms of screening, follow-up diagnostic testing, over-diagnosis, false positive rate, and total radiation exposure.   Counseling on the importance of adherence to annual lung cancer LDCT screening, impact of co-morbidities, and ability or willingness to undergo diagnosis and treatment is imperative for compliance of the program.   Counseling on the importance of continued smoking cessation for former smokers; the importance of smoking cessation for current smokers, and information about tobacco cessation interventions have been given to patient including [employer] and [employer] quit [place] programs.   Written order for lung cancer screening with LDCT has been given to the patient and any and all questions have been answered to the best of my abilities.    Yearly follow up will be coordinated by Burgess Estelle, Thoracic Navigator.  Time Total: 15 minutes  Visit consisted of counseling and education dealing with complex health screening. Greater than 50%  of this time was spent counseling and coordinating care related to the above assessment and plan.  Signed by: Altha Harm, PhD, NP-C [phone] (Work Cell)

## 2019-04-01 ENCOUNTER — Telehealth: Payer: Self-pay | Admitting: *Deleted

## 2019-04-01 NOTE — Telephone Encounter (Signed)
Notified patient of LDCT lung cancer screening program results with recommendation for 6 month follow up imaging. Also notified of incidental findings noted below and is encouraged to discuss further with PCP who will receive a copy of this note and/or the CT report. Patient verbalizes understanding.   IMPRESSION: Lung-RADS 3, probably benign findings. Short-term follow-up in 6 months is recommended with repeat low-dose chest CT without contrast (please use the following order, "CT CHEST LCS NODULE FOLLOW-UP W/O CM").  7.8 mm subpleural nodule in the left upper lobe.  Aortic Atherosclerosis (ICD10-I70.0) and Emphysema (ICD10-J43.9).

## 2019-09-24 ENCOUNTER — Telehealth: Payer: Self-pay | Admitting: *Deleted

## 2019-09-24 DIAGNOSIS — R918 Other nonspecific abnormal finding of lung field: Secondary | ICD-10-CM

## 2019-09-24 DIAGNOSIS — Z87891 Personal history of nicotine dependence: Secondary | ICD-10-CM

## 2019-09-24 NOTE — Telephone Encounter (Signed)
Contacted and scheduled for LCS nodule follow up scan 

## 2019-09-30 ENCOUNTER — Other Ambulatory Visit: Payer: Self-pay

## 2019-09-30 ENCOUNTER — Ambulatory Visit
Admission: RE | Admit: 2019-09-30 | Discharge: 2019-09-30 | Disposition: A | Discharge status: Home / Self Care | Payer: 59 | Source: Ambulatory Visit | Attending: Nurse Practitioner | Admitting: Nurse Practitioner

## 2019-09-30 DIAGNOSIS — Z87891 Personal history of nicotine dependence: Secondary | ICD-10-CM

## 2019-09-30 DIAGNOSIS — R918 Other nonspecific abnormal finding of lung field: Secondary | ICD-10-CM | POA: Diagnosis present

## 2019-10-02 ENCOUNTER — Telehealth: Payer: Self-pay | Admitting: *Deleted

## 2019-10-02 NOTE — Telephone Encounter (Signed)
Notified patient of LDCT lung cancer screening program results with recommendation for 6 month follow up imaging. Also notified of incidental findings noted below and is encouraged to discuss further with PCP who will receive a copy of this note and/or the CT report. Patient verbalizes understanding.   IMPRESSION: 1. Lung-RADS 3, probably benign findings. Short-term follow-up in 6 months is recommended with repeat low-dose chest CT without contrast (please use the following order, "CT CHEST LCS NODULE FOLLOW-UP W/O CM"). 2. Coronary artery calcification 3. Aortic Atherosclerosis (ICD10-I70.0) and Emphysema (ICD10-J43.9).

## 2020-02-02 LAB — EXTERNAL GENERIC LAB PROCEDURE

## 2020-02-02 LAB — COLOGUARD

## 2020-02-15 LAB — COLOGUARD: COLOGUARD: NEGATIVE

## 2020-02-15 LAB — EXTERNAL GENERIC LAB PROCEDURE: COLOGUARD: NEGATIVE

## 2020-03-14 ENCOUNTER — Telehealth: Payer: Self-pay

## 2020-03-14 NOTE — Telephone Encounter (Signed)
Patient notified that it is time to schedule the low dose lung cancer screening CT scan.  Had a physical at his new PCP, [facility], in July and believe he did a chest CT at that office but they are unsure.

## 2020-07-05 NOTE — Telephone Encounter (Signed)
After several attempts able to confirm that CT chest was not done. 06/28/20 Patient is agreeable to scan but will call back to schedule at a later date. Contacted again today and patient will call back when he knows when he is available to schedule.

## 2020-07-07 ENCOUNTER — Other Ambulatory Visit: Payer: Self-pay | Admitting: *Deleted

## 2020-07-07 DIAGNOSIS — Z87891 Personal history of nicotine dependence: Secondary | ICD-10-CM

## 2020-07-07 DIAGNOSIS — Z122 Encounter for screening for malignant neoplasm of respiratory organs: Secondary | ICD-10-CM

## 2020-07-07 NOTE — Progress Notes (Signed)
Contacted and scheduled for LCS nodule follow up scan. Patient is a current smoker with a 40.5 pack year history

## 2020-07-13 ENCOUNTER — Ambulatory Visit
Admission: RE | Admit: 2020-07-13 | Discharge: 2020-07-13 | Disposition: A | Discharge status: Home / Self Care | Payer: 59 | Source: Ambulatory Visit | Attending: Oncology | Admitting: Oncology

## 2020-07-13 ENCOUNTER — Other Ambulatory Visit: Payer: Self-pay

## 2020-07-13 DIAGNOSIS — Z87891 Personal history of nicotine dependence: Secondary | ICD-10-CM | POA: Insufficient documentation

## 2020-07-13 DIAGNOSIS — Z122 Encounter for screening for malignant neoplasm of respiratory organs: Secondary | ICD-10-CM | POA: Diagnosis present

## 2020-07-14 ENCOUNTER — Encounter: Payer: Self-pay | Admitting: *Deleted

## 2020-07-14 NOTE — Progress Notes (Signed)
Noted results from LCS nodule follow up scan below. Will obtain opinion from pulmonary and notify patient. No PCP documented in EMR, will inquire further in discussion with patient.   IMPRESSION: 1. Lung-RADS 4B, suspicious. Additional imaging evaluation or consultation with Pulmonology or Thoracic Surgery recommended. Although the previously described lung nodules have decreased and resolved, a new right lower lobe lesion of volume derived equivalent diameter 23.8 mm is seen. Especially given fluid/secretions in the endobronchial tree, favored to represent an area of infection or aspiration. Consider antibiotic therapy and short-term CT follow-up. 2. Aortic atherosclerosis (ICD10-I70.0), coronary artery atherosclerosis and emphysema (ICD10-J43.9).

## 2020-07-14 NOTE — Progress Notes (Signed)
Comparing CT to the prior CT of February 2021, findings are consistent with aspiration it appears patient does not have primary physician and he would benefit from this.  I suspect he needs evaluation for potential dysphagia.  Been treating with Augmentin 875/125 twice a day days.  This should be taken with meals.  Recommend follow-up CT after therapy.  If persistent findings,will need bronchoscopy.

## 2020-07-15 ENCOUNTER — Telehealth: Payer: Self-pay | Admitting: *Deleted

## 2020-07-15 NOTE — Telephone Encounter (Signed)
Patient is contacted and reviewed recent CT chest results with recommendation for antibiotic therapy and follow up CT chest. No PCP known and patient reports he doesn't know either. Requests I contact his friend [name]. Ms. Manson Passey indicates that [name] is patient's PCP and verbalizes understanding of recommendations.   Results sent to [name] and office called to verbally notify.  IMPRESSION: 1. Lung-RADS 4B, suspicious. Additional imaging evaluation or consultation with Pulmonology or Thoracic Surgery recommended. Although the previously described lung nodules have decreased and resolved, a new right lower lobe lesion of volume derived equivalent diameter 23.8 mm is seen. Especially given fluid/secretions in the endobronchial tree, favored to represent an area of infection or aspiration. Consider antibiotic therapy and short-term CT follow-up. 2. Aortic atherosclerosis (ICD10-I70.0), coronary artery atherosclerosis and emphysema (ICD10-J43.9).

## 2020-08-09 ENCOUNTER — Telehealth: Payer: Self-pay | Admitting: *Deleted

## 2020-08-09 NOTE — Telephone Encounter (Signed)
Left voicemail in attempt to contact patient and discuss follow up of last CT chest. As well as plan next CT chest.

## 2020-08-12 ENCOUNTER — Other Ambulatory Visit: Payer: Self-pay | Admitting: *Deleted

## 2020-08-12 DIAGNOSIS — Z87891 Personal history of nicotine dependence: Secondary | ICD-10-CM

## 2020-08-12 DIAGNOSIS — R918 Other nonspecific abnormal finding of lung field: Secondary | ICD-10-CM

## 2020-08-12 NOTE — Progress Notes (Signed)
Contacted and scheduled for LCS nodule follow up scan. Patient is current smoker with a 40.5 pack year history.

## 2020-08-25 ENCOUNTER — Ambulatory Visit: Payer: 59

## 2020-09-01 ENCOUNTER — Ambulatory Visit: Admission: RE | Admit: 2020-09-01 | Payer: 59 | Source: Ambulatory Visit

## 2020-09-05 ENCOUNTER — Telehealth: Payer: Self-pay | Admitting: *Deleted

## 2020-09-05 NOTE — Telephone Encounter (Signed)
Voicemail left on 09/02/20 and today in attempt to reschedule patient for no show LCS nodule follow up scan.

## 2020-09-23 ENCOUNTER — Telehealth: Payer: Self-pay | Admitting: *Deleted

## 2020-09-23 NOTE — Telephone Encounter (Signed)
Attempted to contact and schedule lung screening scan. Message left for patient to call back to schedule. 

## 2020-09-28 NOTE — Progress Notes (Signed)
09/29/2020 11:14 AM   [name] 02-Jan-1956 [id number]  Referring provider: Miki Kins, FNP [address] [place],  [place] [address]  Chief Complaint  Patient presents with  . Urinary Frequency   Urological history: 1. High risk hematuria - smoker > 40 pack years - CT urogram and cysto 2018 - NED - UA 11-30 RBC's   2. BPH with LU TS - PSA 1.1 04/07/2020 - I PSS 14/4 - PVR 0 mL  HPI: [name] is a 65 y.o. male who presents as a referral for a positive urine dip for blood and nocturia from Miki Kins, FNP with his friend, Byrd Hesselbach.    He complains of nocturia x 3 for years.  He was on tamulosin 0.4 mg in the past, but he felt like it made him go more often.    His friend states he snores and gasps while during his sleep.    Patient denies any modifying or aggravating factors.  Patient denies any gross hematuria, dysuria or suprapubic/flank pain.  Patient denies any fevers, chills, nausea or vomiting.   PMH: Past Medical History:  Diagnosis Date  . Hematuria   . Ulcer     Surgical History: Past Surgical History:  Procedure Laterality Date  . ABDOMINAL SURGERY     "burst blood vessel"  . INNER EAR SURGERY     "redid eardrum"  . stomach ulcer surgery      Home Medications:  Allergies as of 09/29/2020   No Known Allergies     Medication List       Accurate as of September 29, 2020 11:59 PM. If you have any questions, ask your nurse or doctor.        STOP taking these medications   nicotine 14 mg/24hr patch Commonly known as: NICODERM CQ - dosed in mg/24 hours Stopped by: Michiel Cowboy, PA-C     TAKE these medications   acetaminophen 500 MG tablet Commonly known as: TYLENOL Take 500 mg by mouth every 6 (six) hours as needed.   albuterol 108 (90 Base) MCG/ACT inhaler Commonly known as: VENTOLIN HFA Inhale 2 puffs into the lungs every 6 (six) hours as needed for wheezing or shortness of breath.   beclomethasone 40 MCG/ACT  inhaler Commonly known as: Qvar Inhale 1 puff into the lungs 2 (two) times daily.   diazepam 10 MG tablet Commonly known as: Valium Take 30 minutes prior to cystoscopy Started by: Michiel Cowboy, PA-C   Trelegy Ellipta 100-62.5-25 MCG/INH Aepb Generic drug: Fluticasone-Umeclidin-Vilant Inhale 1 puff into the lungs daily.       Allergies: No Known Allergies  Family History: Family History  Problem Relation Age of Onset  . Cancer Mother   . Leukemia Father   . Diabetes Brother   . Kidney cancer Neg Hx   . Prostate cancer Neg Hx   . Bladder Cancer Neg Hx     Social History:  reports that he has been smoking cigarettes. He has a 40.00 pack-year smoking history. He has never used smokeless tobacco. He reports current alcohol use. He reports that he does not use drugs.  ROS: Pertinent ROS in HPI  Physical Exam: BP (!) 149/84   Pulse 98   Ht 5\' 9"  (1.753 m)   Wt 120 lb (54.4 kg)   BMI 17.72 kg/m   Constitutional:  Well nourished. Alert and oriented, No acute distress. HEENT: [place] AT, mask in place.  Trachea midline Cardiovascular: No clubbing, cyanosis, or edema.  Respiratory: Normal respiratory effort, no increased work of breathing. GU: No CVA tenderness.  No bladder fullness or masses.  Patient with circumcised phallus. Urethral meatus is patent.  No penile discharge. No penile lesions or rashes. Scrotum without lesions, cysts, rashes and/or edema.  Testicles are located scrotally bilaterally. No masses are appreciated in the testicles. Left and right epididymis are normal. Rectal: Patient with  normal sphincter tone. Anus and perineum without scarring or rashes. No rectal masses are appreciated. Prostate is approximately 40 grams, no nodules are appreciated. Seminal vesicles could not be palpated Lymph: No cervical or inguinal adenopathy. Neurologic: Grossly intact, no focal deficits, moving all 4 extremities. Psychiatric: Normal mood and affect.  Laboratory  Data: Urinalysis Component     Latest Ref Rng & Units 09/29/2020  Specific Gravity, UA     1.005 - 1.030 1.025  pH, UA     5.0 - 7.5 5.5  Color, UA     Yellow Orange  Appearance Ur     Clear Hazy (A)  Leukocytes,UA     Negative Negative  Protein,UA     Negative/Trace Negative  Glucose, UA     Negative Negative  Ketones, UA     Negative Trace (A)  RBC, UA     Negative 3+ (A)  Bilirubin, UA     Negative Negative  Urobilinogen, Ur     0.2 - 1.0 mg/dL 0.2  Nitrite, UA     Negative Negative  Microscopic Examination      See below:   Component     Latest Ref Rng & Units 09/29/2020  WBC, UA     0 - 5 /hpf 0-5  RBC     0 - 2 /hpf 11-30 (A)  Epithelial Cells (non renal)     0 - 10 /hpf 0-10  Bacteria, UA     None seen/Few None seen    I have reviewed the labs.   Pertinent Imaging: Results for [name], [name] (MRN [id number]) as of 09/29/2020 15:31  Ref. Range 09/29/2020 15:28  Scan Result Unknown 0 ml   Assessment & Plan:    1. Microscopic hematuria - explained to patient and his friend that blood in the urine could be been a result of UTI, BPH, stones or cancer - Due to patient's extensive smoking history he is placed in the high risk category for hematuria and per AUA guidelines will need a CT urogram and cystoscopy for further evaluation of the micro heme -We will schedule CT urogram and cystoscopy -Patient requests a Valium 10 mg to take 30 minutes prior to the procedure and he will have a driver -I have explained to the patient that they will  be scheduled for a cystoscopy in our office to evaluate their bladder.  The cystoscopy consists of passing a tube with a lens up through their urethra and into their urinary bladder.   We will inject the urethra with a lidocaine gel prior to introducing the cystoscope to help with any discomfort during the procedure.   After the procedure, they might experience blood in the urine and discomfort with urination.  This will abate  after the first few voids.  I have  encouraged the patient to increase water intake  during this time.  Patient denies any allergies to lidocaine.   2. Nocturia -Explained to his friend and the patient that his nocturia is likely due to sleep apnea as his friend is witnessed intense snoring and gasping for air while he  sleeps -Highly recommend they speak with her PCP to undergo a sleep study to be evaluated for sleep apnea  Return for CT Urogram report and cystoscopy for micro heme - significant smoking history .  These notes generated with voice recognition software. I apologize for typographical errors.  Michiel Cowboy, PA-C  [facility] [address]  [address] [place], [place] [address] [phone]

## 2020-09-29 ENCOUNTER — Encounter: Payer: Self-pay | Admitting: Urology

## 2020-09-29 ENCOUNTER — Ambulatory Visit (INDEPENDENT_AMBULATORY_CARE_PROVIDER_SITE_OTHER): Payer: 59 | Admitting: Urology

## 2020-09-29 ENCOUNTER — Other Ambulatory Visit: Payer: Self-pay

## 2020-09-29 VITALS — BP 149/84 | HR 98 | Ht 69.0 in | Wt 120.0 lb

## 2020-09-29 DIAGNOSIS — R3129 Other microscopic hematuria: Secondary | ICD-10-CM | POA: Diagnosis not present

## 2020-09-29 DIAGNOSIS — R351 Nocturia: Secondary | ICD-10-CM | POA: Diagnosis not present

## 2020-09-29 LAB — BLADDER SCAN AMB NON-IMAGING: Scan Result: 0

## 2020-09-29 MED ORDER — DIAZEPAM 10 MG PO TABS: ORAL_TABLET | ORAL | 0 refills | Status: DC

## 2020-09-29 NOTE — Patient Instructions (Signed)
Cystoscopy Cystoscopy is a procedure that is used to help diagnose and sometimes treat conditions that affect the lower urinary tract. The lower urinary tract includes the bladder and the urethra. The urethra is the tube that drains urine from the bladder. Cystoscopy is done using a thin, tube-shaped instrument with a light and camera at the end (cystoscope). The cystoscope may be hard or flexible, depending on the goal of the procedure. The cystoscope is inserted through the urethra, into the bladder. Cystoscopy may be recommended if you have:  Urinary tract infections that keep coming back.  Blood in the urine (hematuria).  An inability to control when you urinate (urinary incontinence) or an overactive bladder.  Unusual cells found in a urine sample.  A blockage in the urethra, such as a urinary stone.  Painful urination.  An abnormality in the bladder found during an intravenous pyelogram (IVP) or CT scan. Cystoscopy may also be done to remove a sample of tissue to be examined under a microscope (biopsy). What are the risks? Generally, this is a safe procedure. However, problems may occur, including:  Infection.  Bleeding.  What happens during the procedure?  1. You will be given one or more of the following: ? A medicine to numb the area (local anesthetic). 2. The area around the opening of your urethra will be cleaned. 3. The cystoscope will be passed through your urethra into your bladder. 4. Germ-free (sterile) fluid will flow through the cystoscope to fill your bladder. The fluid will stretch your bladder so that your health care provider can clearly examine your bladder walls. 5. Your doctor will look at the urethra and bladder. 6. The cystoscope will be removed The procedure may vary among health care providers  What can I expect after the procedure? After the procedure, it is common to have: 1. Some soreness or pain in your abdomen and urethra. 2. Urinary symptoms.  These include: ? Mild pain or burning when you urinate. Pain should stop within a few minutes after you urinate. This may last for up to 1 week. ? A small amount of blood in your urine for several days. ? Feeling like you need to urinate but producing only a small amount of urine. Follow these instructions at home: General instructions  Return to your normal activities as told by your health care provider.   Do not drive for 24 hours if you were given a sedative during your procedure.  Watch for any blood in your urine. If the amount of blood in your urine increases, call your health care provider.  If a tissue sample was removed for testing (biopsy) during your procedure, it is up to you to get your test results. Ask your health care provider, or the department that is doing the test, when your results will be ready.  Drink enough fluid to keep your urine pale yellow.  Keep all follow-up visits as told by your health care provider. This is important. Contact a health care provider if you:  Have pain that gets worse or does not get better with medicine, especially pain when you urinate.  Have trouble urinating.  Have more blood in your urine. Get help right away if you:  Have blood clots in your urine.  Have abdominal pain.  Have a fever or chills.  Are unable to urinate. Summary  Cystoscopy is a procedure that is used to help diagnose and sometimes treat conditions that affect the lower urinary tract.  Cystoscopy is done using   a thin, tube-shaped instrument with a light and camera at the end.  After the procedure, it is common to have some soreness or pain in your abdomen and urethra.  Watch for any blood in your urine. If the amount of blood in your urine increases, call your health care provider.  If you were prescribed an antibiotic medicine, take it as told by your health care provider. Do not stop taking the antibiotic even if you start to feel better. This  information is not intended to replace advice given to you by your health care provider. Make sure you discuss any questions you have with your health care provider. Document Revised: 07/15/2018 Document Reviewed: 07/15/2018 Elsevier Patient Education  2020 Elsevier Inc.   

## 2020-09-30 LAB — URINALYSIS, COMPLETE
Bilirubin, UA: NEGATIVE
Glucose, UA: NEGATIVE
Leukocytes,UA: NEGATIVE
Nitrite, UA: NEGATIVE
Protein,UA: NEGATIVE
Specific Gravity, UA: 1.025 (ref 1.005–1.030)
Urobilinogen, Ur: 0.2 mg/dL (ref 0.2–1.0)
pH, UA: 5.5 (ref 5.0–7.5)

## 2020-09-30 LAB — MICROSCOPIC EXAMINATION: Bacteria, UA: NONE SEEN

## 2020-10-01 LAB — CULTURE, URINE COMPREHENSIVE

## 2020-10-19 ENCOUNTER — Other Ambulatory Visit: Payer: Self-pay | Admitting: Urology

## 2020-10-20 ENCOUNTER — Other Ambulatory Visit: Payer: Self-pay | Admitting: Urology

## 2020-10-20 ENCOUNTER — Other Ambulatory Visit: Payer: Self-pay | Admitting: Specialist

## 2020-10-20 DIAGNOSIS — R059 Cough, unspecified: Secondary | ICD-10-CM

## 2020-10-20 DIAGNOSIS — R9389 Abnormal findings on diagnostic imaging of other specified body structures: Secondary | ICD-10-CM

## 2020-10-20 DIAGNOSIS — R0609 Other forms of dyspnea: Secondary | ICD-10-CM

## 2020-10-20 DIAGNOSIS — R911 Solitary pulmonary nodule: Secondary | ICD-10-CM

## 2020-10-20 DIAGNOSIS — R0600 Dyspnea, unspecified: Secondary | ICD-10-CM

## 2020-10-27 ENCOUNTER — Ambulatory Visit: Payer: 59

## 2020-11-10 ENCOUNTER — Ambulatory Visit: Payer: 59 | Attending: Specialist

## 2020-11-14 ENCOUNTER — Telehealth: Payer: Self-pay | Admitting: Urology

## 2020-11-23 ENCOUNTER — Other Ambulatory Visit: Payer: Self-pay | Admitting: *Deleted

## 2020-11-23 DIAGNOSIS — R3129 Other microscopic hematuria: Secondary | ICD-10-CM

## 2020-11-24 ENCOUNTER — Other Ambulatory Visit: Payer: Self-pay | Admitting: Urology

## 2020-12-12 ENCOUNTER — Other Ambulatory Visit: Payer: Self-pay

## 2020-12-12 ENCOUNTER — Ambulatory Visit
Admission: RE | Admit: 2020-12-12 | Discharge: 2020-12-12 | Disposition: A | Discharge status: Home / Self Care | Payer: 59 | Source: Ambulatory Visit | Attending: Urology | Admitting: Urology

## 2020-12-12 DIAGNOSIS — R3129 Other microscopic hematuria: Secondary | ICD-10-CM | POA: Diagnosis present

## 2020-12-12 LAB — POCT I-STAT CREATININE: Creatinine, Ser: 0.9 mg/dL (ref 0.61–1.24)

## 2020-12-12 MED ORDER — IOHEXOL 300 MG/ML  SOLN
125.0000 mL | Freq: Once | INTRAMUSCULAR | Status: AC | PRN
  Administered 2020-12-12: 125 mL via INTRAVENOUS

## 2020-12-26 ENCOUNTER — Telehealth: Payer: Self-pay | Admitting: *Deleted

## 2020-12-26 NOTE — Telephone Encounter (Signed)
-----   Message from Riki Altes, MD sent at 12/25/2020 10:41 AM EDT ----- CT showed no upper tract abnormalities.  It does not look like he was ever scheduled for cystoscopy or he canceled.  Recommend rescheduling

## 2020-12-26 NOTE — Telephone Encounter (Signed)
Notified patient as instructed, Discussed follow-up appointments, patient agrees  

## 2021-01-19 ENCOUNTER — Encounter: Payer: Self-pay | Admitting: Urology

## 2021-01-19 ENCOUNTER — Other Ambulatory Visit: Payer: Self-pay

## 2021-01-19 ENCOUNTER — Ambulatory Visit (INDEPENDENT_AMBULATORY_CARE_PROVIDER_SITE_OTHER): Payer: 59 | Admitting: Urology

## 2021-01-19 VITALS — BP 105/66 | HR 103 | Ht 69.0 in | Wt 120.0 lb

## 2021-01-19 DIAGNOSIS — R3129 Other microscopic hematuria: Secondary | ICD-10-CM | POA: Diagnosis not present

## 2021-01-19 NOTE — Progress Notes (Signed)
01/19/2021 6:05 PM   Karoline Caldwell [birth year] [id number]  Referring provider: Miki Kins, FNP [address] [place],  [place] [address]  Chief Complaint  Patient presents with   Cysto    HPI: 65 y.o. male seen by [facility] 09/29/2020 for urinary frequency.  Noted to have 11-30 RBCs on urine microscopy and CT urogram/cystoscopy was recommended Negative hematuria evaluation 2018 with cystoscopy showing no bladder mucosal abnormalities and a short, nonocclusive prostate Denies gross hematuria CTU performed 12/12/2020 showed no upper tract abnormalities; incidentally noted focus of arterial enhancement in the central gland He was scheduled for cystoscopy today however refuses to have the procedure done and states he only wanted to have his CT report Last PSA was 1.06 April 2020 DRE February 2022 was benign   PMH: Past Medical History:  Diagnosis Date   Hematuria    Ulcer     Surgical History: Past Surgical History:  Procedure Laterality Date   ABDOMINAL SURGERY     "burst blood vessel"   INNER EAR SURGERY     "redid eardrum"   stomach ulcer surgery      Home Medications:  Allergies as of 01/19/2021   No Known Allergies      Medication List        Accurate as of January 19, 2021  6:05 PM. If you have any questions, ask your nurse or doctor.          acetaminophen 500 MG tablet Commonly known as: TYLENOL Take 500 mg by mouth every 6 (six) hours as needed.   albuterol 108 (90 Base) MCG/ACT inhaler Commonly known as: VENTOLIN HFA Inhale 2 puffs into the lungs every 6 (six) hours as needed for wheezing or shortness of breath.   beclomethasone 40 MCG/ACT inhaler Commonly known as: Qvar Inhale 1 puff into the lungs 2 (two) times daily.   diazepam 10 MG tablet Commonly known as: Valium Take 30 minutes prior to cystoscopy   Trelegy Ellipta 100-62.5-25 MCG/INH Aepb Generic drug: Fluticasone-Umeclidin-Vilant Inhale 1 puff into the lungs daily.         Allergies: No Known Allergies  Family History: Family History  Problem Relation Age of Onset   Cancer Mother    Leukemia Father    Diabetes Brother    Kidney cancer Neg Hx    Prostate cancer Neg Hx    Bladder Cancer Neg Hx     Social History:  reports that he has been smoking cigarettes. He has a 40.00 pack-year smoking history. He has never used smokeless tobacco. He reports current alcohol use. He reports that he does not use drugs.   Physical Exam: BP 105/66   Pulse (!) 103   Ht 5\' 9"  (1.753 m)   Wt 120 lb (54.4 kg)   BMI 17.72 kg/m   Constitutional:  Alert and oriented, No acute distress. HEENT: [place] AT, moist mucus membranes.  Trachea midline, no masses. Cardiovascular: No clubbing, cyanosis, or edema. Respiratory: Normal respiratory effort, no increased work of breathing. Skin: No rashes, bruises or suspicious lesions. Neurologic: Grossly intact, no focal deficits, moving all 4 extremities. Psychiatric: Normal mood and affect.  Laboratory Data:  Urinalysis 01/19/2021 dipstick 3+ blood/microscopy 11-30 RBC   Pertinent Imaging: CT images were personally reviewed and interpreted  CT HEMATURIA WORKUP  Narrative CLINICAL DATA:  Microscopic hematuria  EXAM: CT ABDOMEN AND PELVIS WITHOUT AND WITH CONTRAST  TECHNIQUE: Multidetector CT imaging of the abdomen and pelvis was performed following the standard protocol before and following  the bolus administration of intravenous contrast.  CONTRAST:  OMNIPAQUE IOHEXOL 300 MG/ML  SOLN  COMPARISON:  CT Dec 25, 2016  FINDINGS: Lower chest: No acute abnormality.  Hepatobiliary: Tiny bilobar hypodense hepatic lesions the largest of which measures 5 mm in the right lobe of the liver technically too small to accurately characterize but favored represent benign etiology such as hemangiomas. No solid suspicious hepatic lesions. Gallbladder is unremarkable. No biliary ductal dilation.  Pancreas: Within normal  limits.  Spleen: Within normal limits.  Adrenals/Urinary Tract: Bilateral adrenal glands within normal limits.  No hydronephrosis. Symmetric enhancement excretion of contrast in the bilateral kidneys. No suspicious filling defects visualized within the opacified portions of the collecting system or ureters on delayed imaging.  No renal, ureteral or bladder calculus. No solid enhancing renal lesion.  Mild trabecular bladder wall thickening.  Stomach/Bowel: Stable postsurgical changes involving the stomach at the GE junction/gastric cardia. Normal positioning of the duodenum/ligament of [place]. No suspicious small bowel wall thickening or dilation. The appendix and terminal ileum are grossly unremarkable. Sigmoid colonic diverticulosis without findings of acute diverticulitis.  Vascular/Lymphatic: Aortic atherosclerosis without aneurysmal dilation. No enlarged abdominal or pelvic lymph nodes.  Reproductive: Prostatic calcifications. Focus of arterial enhancement in the central gland on image 70/9. Recommend correlation with PSA.  Other: No abdominopelvic ascites.  Musculoskeletal: Multilevel degenerative changes spine. No suspicious lytic or blastic lesion of bone. Degenerative change of the hips and SI joints.  IMPRESSION: 1. No hydronephrosis. No renal, ureteral, or bladder calculus. No solid enhancing renal lesion. 2. Mild trabecular bladder wall thickening, which may reflect sequela of chronic outflow impedance. 3. Focus of arterial enhancement in the central gland of the prostate gland, nonspecific. Recommend correlation with PSA. 4. Sigmoid colonic diverticulosis without findings of acute diverticulitis. 5. Aortic atherosclerosis.  Aortic Atherosclerosis (ICD10-I70.0).   Electronically Signed By: Maudry Mayhew MD On: 12/13/2020 09:40  No results found for this or any previous visit.   Assessment & Plan:    1. Microscopic hematuria CT findings were  discussed with Mr. Ramsburg and his friend Byrd Hesselbach I again reviewed the recommended evaluation for high risk hematuria which includes cystoscopy for lower tract evaluation We discussed that failure to have the lower tract evaluation performed could result in an undiagnosed bladder cancer which could lead to growth and even metastasis He stated he was willing to accept this risk I did send his urine for cytology Recommend follow-up with Carollee Herter in 6 months for repeat urinalysis  2.  BPH with LUTS Follow-up PSA; CT findings on prostate are nonspecific   Riki Altes, MD  [facility] [address], [address] [place], [place] [address] [phone]

## 2021-01-20 LAB — MICROSCOPIC EXAMINATION: Bacteria, UA: NONE SEEN

## 2021-01-20 LAB — URINALYSIS, COMPLETE
Bilirubin, UA: NEGATIVE
Glucose, UA: NEGATIVE
Ketones, UA: NEGATIVE
Leukocytes,UA: NEGATIVE
Nitrite, UA: NEGATIVE
Protein,UA: NEGATIVE
Specific Gravity, UA: 1.015 (ref 1.005–1.030)
Urobilinogen, Ur: 0.2 mg/dL (ref 0.2–1.0)
pH, UA: 5.5 (ref 5.0–7.5)

## 2021-01-20 LAB — PSA: Prostate Specific Ag, Serum: 1.7 ng/mL (ref 0.0–4.0)

## 2021-01-23 ENCOUNTER — Telehealth: Payer: Self-pay | Admitting: *Deleted

## 2021-01-23 LAB — CYTOLOGY - NON PAP

## 2021-01-23 NOTE — Telephone Encounter (Signed)
-----   Message from Riki Altes, MD sent at 01/22/2021  8:29 AM EDT ----- PSA normal and stable at 1.7

## 2021-01-23 NOTE — Telephone Encounter (Signed)
Left message on voice mail per DPR .  °

## 2021-01-24 ENCOUNTER — Telehealth: Payer: Self-pay | Admitting: *Deleted

## 2021-01-24 NOTE — Telephone Encounter (Signed)
Notified patient as instructed,  left message on voice mail per [facility]

## 2021-01-24 NOTE — Telephone Encounter (Signed)
-----   Message from Riki Altes, MD sent at 01/23/2021  8:18 PM EDT ----- Urine cytology showed no abnormal appearing cells

## 2021-01-30 NOTE — Telephone Encounter (Signed)
Error

## 2021-05-20 ENCOUNTER — Other Ambulatory Visit: Payer: Self-pay

## 2021-05-20 ENCOUNTER — Ambulatory Visit (INDEPENDENT_AMBULATORY_CARE_PROVIDER_SITE_OTHER): Payer: 59

## 2021-05-20 ENCOUNTER — Ambulatory Visit
Admission: RE | Admit: 2021-05-20 | Discharge: 2021-05-20 | Disposition: A | Discharge status: Home / Self Care | Payer: 59 | Source: Ambulatory Visit | Attending: Internal Medicine | Admitting: Internal Medicine

## 2021-05-20 VITALS — BP 126/78 | HR 87 | Temp 97.9°F | Resp 16

## 2021-05-20 DIAGNOSIS — S60352A Superficial foreign body of left thumb, initial encounter: Secondary | ICD-10-CM | POA: Diagnosis not present

## 2021-05-20 DIAGNOSIS — Z23 Encounter for immunization: Secondary | ICD-10-CM | POA: Diagnosis not present

## 2021-05-20 DIAGNOSIS — S60351A Superficial foreign body of right thumb, initial encounter: Secondary | ICD-10-CM

## 2021-05-20 HISTORY — DX: Chronic obstructive pulmonary disease, unspecified: J44.9

## 2021-05-20 MED ORDER — CEPHALEXIN 500 MG PO CAPS: 500.0000 mg | ORAL_CAPSULE | Freq: Three times a day (TID) | ORAL | 0 refills | Status: DC

## 2021-05-20 MED ORDER — TETANUS-DIPHTH-ACELL PERTUSSIS 5-2.5-18.5 LF-MCG/0.5 IM SUSY
0.5000 mL | PREFILLED_SYRINGE | Freq: Once | INTRAMUSCULAR | Status: AC
  Administered 2021-05-20: 0.5 mL via INTRAMUSCULAR

## 2021-05-20 NOTE — ED Triage Notes (Addendum)
Patient c/o RT hand injury that happened yesterday morning .   Patient has fishing hook stuck in RT Thumb.   Patient denies any numbness, changes to sensation, or deficits in movement.   Last tetanus unknown.

## 2021-05-20 NOTE — ED Provider Notes (Signed)
[name]    CSN: [id number] Arrival date & time: 05/20/21  6295      History   Chief Complaint Chief Complaint  Patient presents with   Finger Injury   APPT 0945    HPI [name] is a 65 y.o. male who presents with a fish hook stuck in his R thumb since yesterday am. He tried to remove it but could not. Has been moving the hook and does not feel is in the bone. Does not recall his last TD    Past Medical History:  Diagnosis Date   COPD (chronic obstructive pulmonary disease) (HCC)    Hematuria    Ulcer     Patient Active Problem List   Diagnosis Date Noted   Sepsis (HCC) 11/15/2016    Past Surgical History:  Procedure Laterality Date   ABDOMINAL SURGERY     "burst blood vessel"   INNER EAR SURGERY     "redid eardrum"   stomach ulcer surgery         Home Medications    Prior to Admission medications   Medication Sig Start Date End Date Taking? Authorizing Provider  cephALEXin (KEFLEX) 500 MG capsule Take 1 capsule (500 mg total) by mouth 3 (three) times daily. 05/20/21  Yes Rodriguez-Southworth, Nettie Elm, PA-C  acetaminophen (TYLENOL) 500 MG tablet Take 500 mg by mouth every 6 (six) hours as needed.    [provider]  albuterol (PROVENTIL HFA;VENTOLIN HFA) 108 (90 Base) MCG/ACT inhaler Inhale 2 puffs into the lungs every 6 (six) hours as needed for wheezing or shortness of breath. 11/17/16   Alford Highland, MD  beclomethasone (QVAR) 40 MCG/ACT inhaler Inhale 1 puff into the lungs 2 (two) times daily. 11/17/16   Alford Highland, MD  diazepam (VALIUM) 10 MG tablet Take 30 minutes prior to cystoscopy 09/29/20   McGowan, Shannon A, PA-C  TRELEGY ELLIPTA 100-62.5-25 MCG/INH AEPB Inhale 1 puff into the lungs daily. 08/10/20   [provider]    Family History Family History  Problem Relation Age of Onset   Cancer Mother    Leukemia Father    Diabetes Brother    Kidney cancer Neg Hx    Prostate cancer Neg Hx    Bladder Cancer  Neg Hx     Social History Social History   Tobacco Use   Smoking status: Every Day    Packs/day: 1.00    Years: 40.00    Pack years: 40.00    Types: Cigarettes   Smokeless tobacco: Never   Tobacco comments:    on the patch  Substance Use Topics   Alcohol use: Yes    Comment: quit April 12th 2018   Drug use: No     Allergies   Patient has no known allergies.   Review of Systems Review of Systems FB R thumb, no redness or increased pain on location.   Physical Exam Triage Vital Signs ED Triage Vitals  Enc Vitals Group     BP 05/20/21 0948 126/78     Pulse Rate 05/20/21 0948 87     Resp 05/20/21 0948 16     Temp 05/20/21 0948 97.9 F (36.6 C)     Temp Source 05/20/21 0948 Oral     SpO2 05/20/21 0948 92 %     Weight --      Height --      Head Circumference --      Peak Flow --      Pain Score  05/20/21 0955 3     Pain Loc --      Pain Edu? --      Excl. in GC? --    No data found.  Updated Vital Signs BP 126/78 (BP Location: Left Arm)   Pulse 87   Temp 97.9 F (36.6 C) (Oral)   Resp 16   SpO2 92%   Visual Acuity Right Eye Distance:   Left Eye Distance:   Bilateral Distance:    Right Eye Near:   Left Eye Near:    Bilateral Near:     Physical Exam Vitals and nursing note reviewed.  Musculoskeletal:        General: Normal range of motion.  Skin:    Comments: R THUMB- with 1/2 of a fish hook stuck on medial distal thumb  Neurological:     Mental Status: He is oriented to person, place, and time.     Gait: Gait normal.  Psychiatric:        Mood and Affect: Mood normal.        Behavior: Behavior normal.        Thought Content: Thought content normal.        Judgment: Judgment normal.     UC Treatments / Results  Labs (all labs ordered are listed, but only abnormal results are displayed) Labs Reviewed - No data to display  EKG   Radiology DG Finger Thumb Right  Result Date: 05/20/2021 CLINICAL DATA:  Fishhook in thumb. EXAM: RIGHT  THUMB 2+V COMPARISON:  None. FINDINGS: Jake Seats is seen within the soft tissues overlying the tuft of the distal phalanx. No underlying osseous fracture or dislocation. IMPRESSION: Fishhook is seen within the soft tissues overlying the tuft of the distal phalanx. No underlying osseous abnormality. Electronically Signed   By: Bary Richard M.D.   On: 05/20/2021 10:40    Procedures Foreign Body Removal  Date/Time: 05/20/2021 11:00 AM Performed by: Garey Ham, PA-C Authorized by: Garey Ham, PA-C   Consent:    Consent obtained:  Verbal   Consent given by:  Patient   Risks, benefits, and alternatives were discussed: yes     Risks discussed:  Infection and pain Universal protocol:    Patient identity confirmed:  Verbally with patient Location:    Location:  Finger   Finger location:  R thumb   Depth:  Subcutaneous   Tendon involvement:  None Pre-procedure details:    Imaging:  X-ray   Neurovascular status: intact     Preparation: Patient was prepped and draped in usual sterile fashion   Anesthesia:    Anesthesia method:  Local infiltration   Local anesthetic:  Lidocaine 1% w/o epi Procedure type:    Procedure complexity:  Simple Procedure details:    Localization method:  Visualized   Removal mechanism:  Hemostat   Foreign bodies recovered:  1   Intact foreign body removal: yes   Post-procedure details:    Neurovascular status: intact     Skin closure:  None   Dressing:  Non-adherent dressing and antibiotic ointment   Procedure completion:  Tolerated well, no immediate complications Comments:     The patient inspected the fish hook and confirmed all the parts were present.  (including critical care time)  Medications Ordered in UC Medications  Tdap (BOOSTRIX) injection 0.5 mL (0.5 mLs Intramuscular Given 05/20/21 1014)    Initial Impression / Assessment and Plan / UC Course  I have reviewed the triage vital signs and the nursing  notes.Fish  hook FB in R thumb. It was completely removed. Antibiotic ointment and Sterile dressing applied  I placed him on Keflex for a few day to prevent infection as noted.      Final Clinical Impressions(s) / UC Diagnoses   Final diagnoses:  Foreign body of left thumb, initial encounter     Discharge Instructions      Watch for signs of infection like redness and more pain.  Keep the Band-Aid on today, ones the puncture whole has stopped bleeding, and forms a scab you may leave the bandage off     ED Prescriptions     Medication Sig Dispense Auth. Provider   cephALEXin (KEFLEX) 500 MG capsule Take 1 capsule (500 mg total) by mouth 3 (three) times daily. 15 capsule Rodriguez-Southworth, Nettie Elm, PA-C      PDMP not reviewed this encounter.   Garey Ham, PA-C 05/20/21 1105

## 2021-05-20 NOTE — Discharge Instructions (Signed)
Watch for signs of infection like redness and more pain.  Keep the Band-Aid on today, ones the puncture whole has stopped bleeding, and forms a scab you may leave the bandage off

## 2021-11-04 ENCOUNTER — Ambulatory Visit: Admission: EM | Admit: 2021-11-04 | Discharge: 2021-11-04 | Discharge status: Against Medical Advice | Payer: 59

## 2021-11-05 DIAGNOSIS — J209 Acute bronchitis, unspecified: Secondary | ICD-10-CM | POA: Diagnosis not present

## 2021-11-05 DIAGNOSIS — R0602 Shortness of breath: Secondary | ICD-10-CM | POA: Diagnosis not present

## 2022-05-01 DIAGNOSIS — R7303 Prediabetes: Secondary | ICD-10-CM | POA: Diagnosis not present

## 2022-05-01 DIAGNOSIS — R0602 Shortness of breath: Secondary | ICD-10-CM | POA: Diagnosis not present

## 2022-05-01 DIAGNOSIS — R5383 Other fatigue: Secondary | ICD-10-CM | POA: Diagnosis not present

## 2022-05-01 DIAGNOSIS — J449 Chronic obstructive pulmonary disease, unspecified: Secondary | ICD-10-CM | POA: Diagnosis not present

## 2022-06-15 IMAGING — CT CT ABD-PEL WO/W CM
3 of 12 series · 11 of 46 positions shown, 17 images · IV contrast (omnipaque)
Comparison: CT December 25, 2016

CLINICAL DATA: Microscopic hematuria

EXAM:
CT ABDOMEN AND PELVIS WITHOUT AND WITH CONTRAST
TECHNIQUE: Multidetector CT imaging of the abdomen and pelvis was performed
following the standard protocol before and following the bolus
administration of intravenous contrast.
CONTRAST:  125mL OMNIPAQUE IOHEXOL 300 MG/ML  SOLN

[Series 2: abd without pre 5.00 · axial · non-contrast · 0.77mm/px · z∈[-1559,-1234]mm · 7 of 87 slices shown, 12 images]
[im 11/87  soft-tissue]
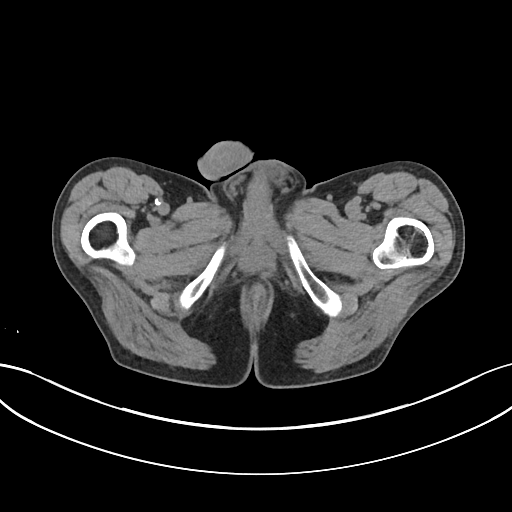
[im 11/87  bone]
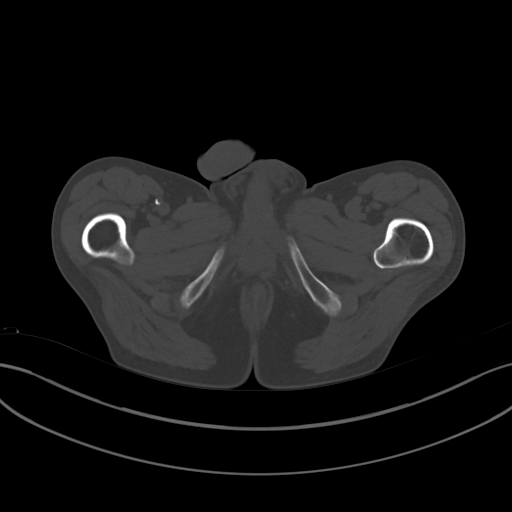
[im 22/87  soft-tissue]
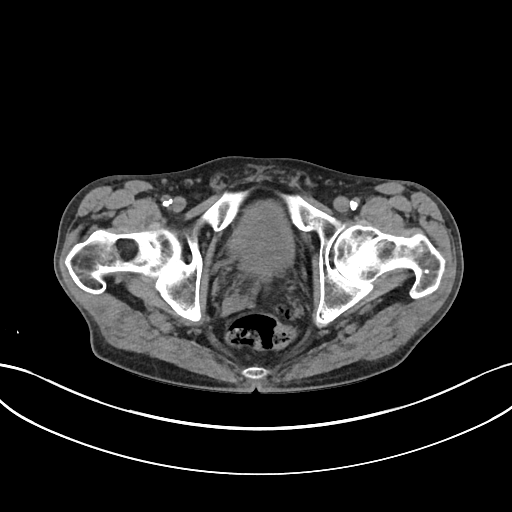
[im 33/87  soft-tissue]
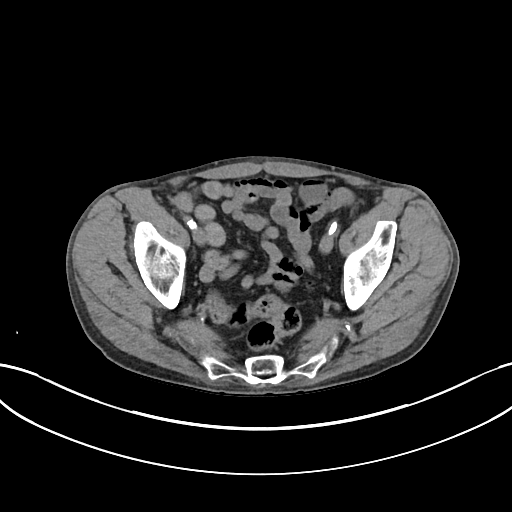
[im 44/87  soft-tissue]
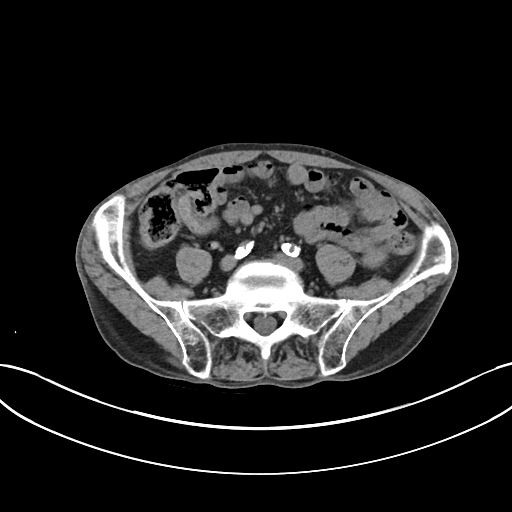
[im 44/87  lung]
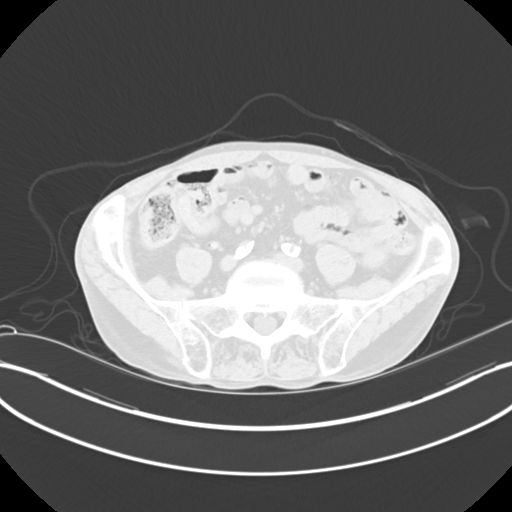
[im 54/87  soft-tissue]
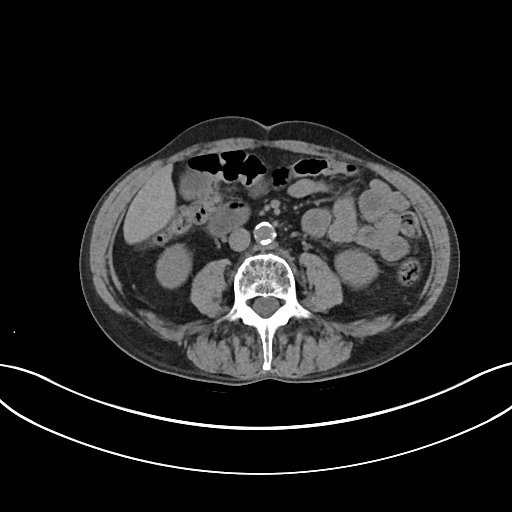
[im 54/87  lung]
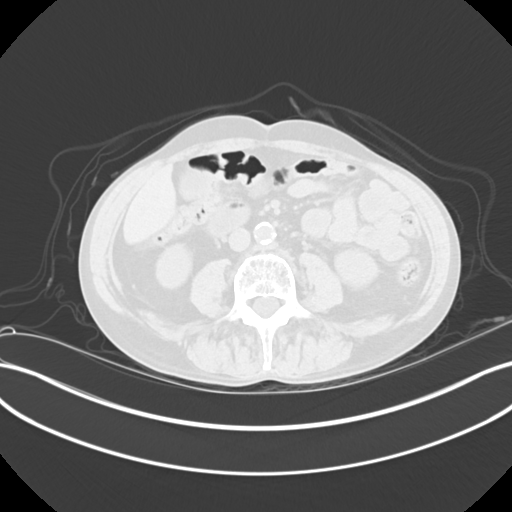
[im 65/87  soft-tissue]
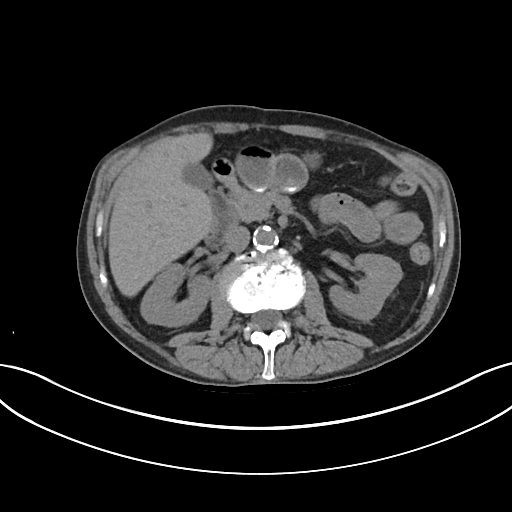
[im 65/87  lung]
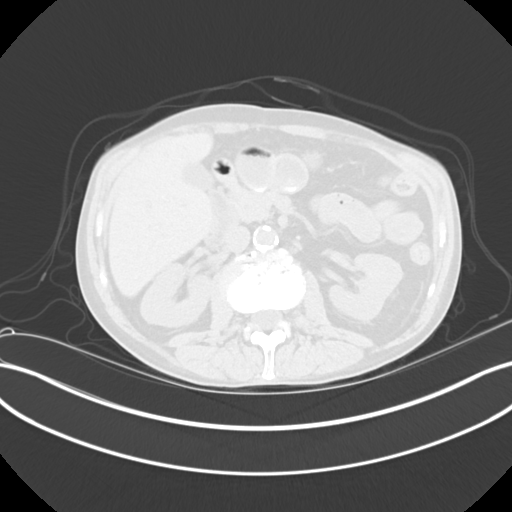
[im 76/87  soft-tissue]
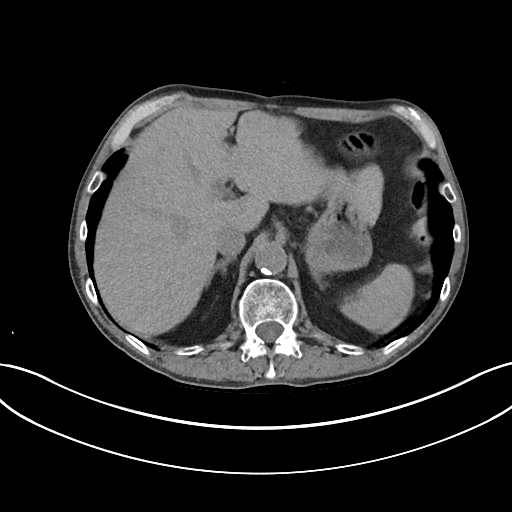
[im 76/87  lung]
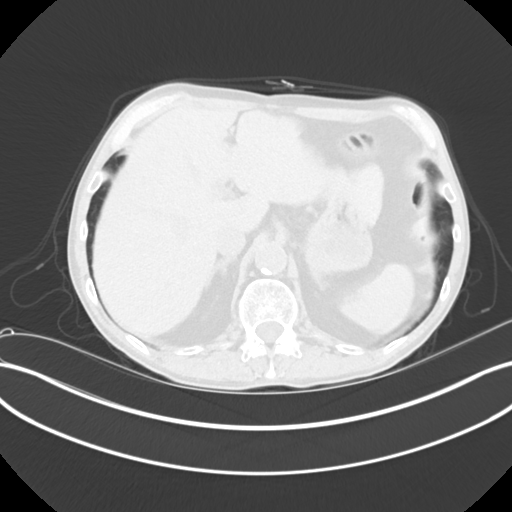

[Series 5: cor without without pre 2.00 cor · coronal · non-contrast · 0.64mm/px · 2 of 126 slices shown, 3 images]
[im 42/126  soft-tissue]
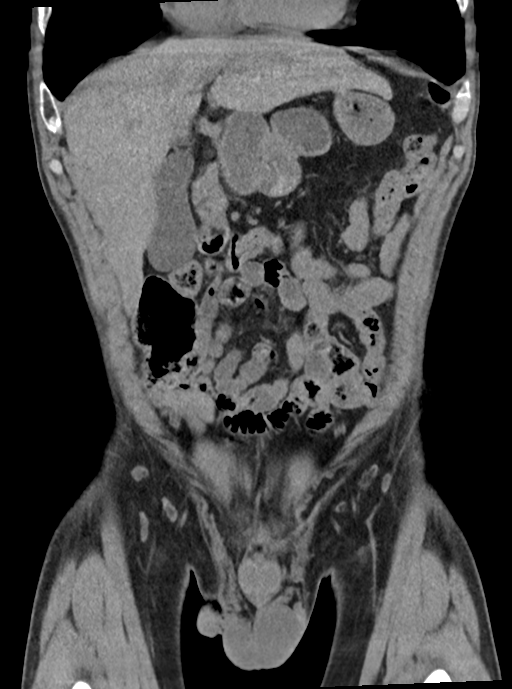
[im 42/126  bone]
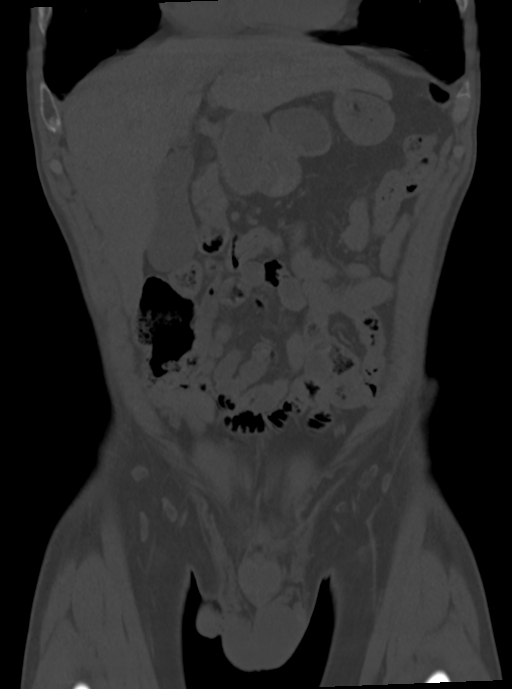
[im 84/126  soft-tissue]
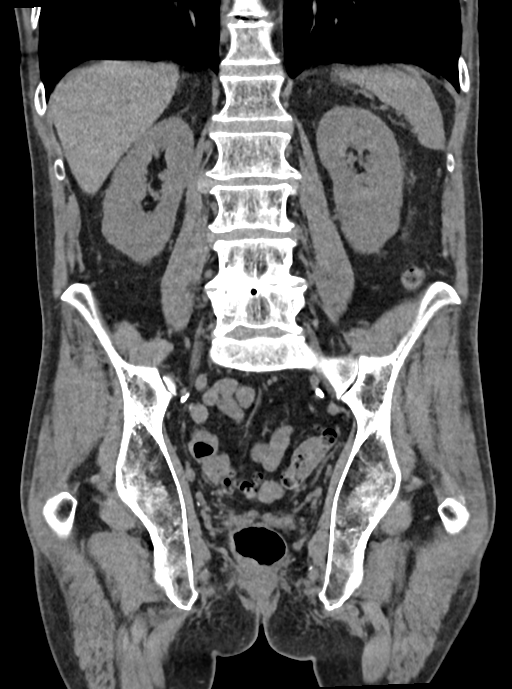

[Series 9: axial with hematuria with 5.00 · axial · 0.77mm/px · z∈[-1559,-1504]mm · 2 of 87 slices shown]
[im 11/87  soft-tissue]
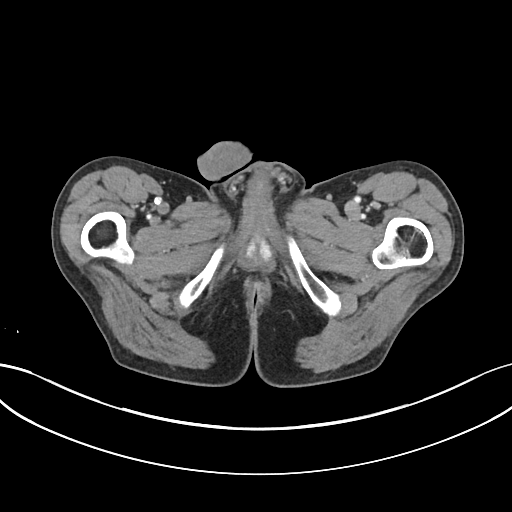
[im 22/87  soft-tissue]
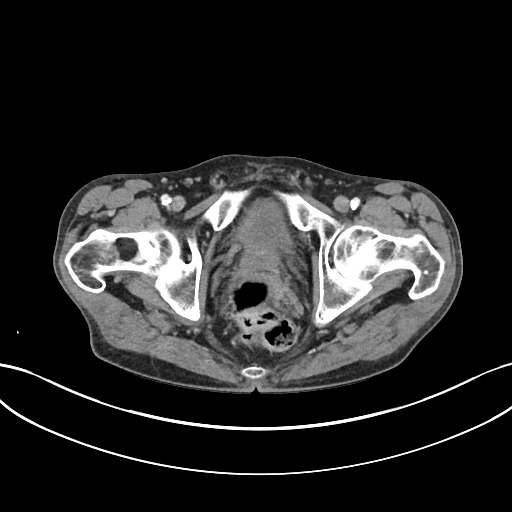

[11 of 46 positions shown; findings below may reference images not displayed]

FINDINGS: Lower chest: No acute abnormality.

Hepatobiliary: Tiny bilobar hypodense hepatic lesions the largest of
which measures 5 mm in the right lobe of the liver technically too
small to accurately characterize but favored represent benign
etiology such as hemangiomas. No solid suspicious hepatic lesions.
Gallbladder is unremarkable. No biliary ductal dilation.

Pancreas: Within normal limits.

Spleen: Within normal limits.

Adrenals/Urinary Tract: Bilateral adrenal glands within normal
limits.

No hydronephrosis. Symmetric enhancement excretion of contrast in
the bilateral kidneys. No suspicious filling defects visualized
within the opacified portions of the collecting system or ureters on
delayed imaging.

No renal, ureteral or bladder calculus. No solid enhancing renal
lesion.

Mild trabecular bladder wall thickening.

Stomach/Bowel: Stable postsurgical changes involving the stomach at
the GE junction/gastric cardia. Normal positioning of the
duodenum/ligament of [place]. No suspicious small bowel wall
thickening or dilation. The appendix and terminal ileum are grossly
unremarkable. Sigmoid colonic diverticulosis without findings of
acute diverticulitis.

Vascular/Lymphatic: Aortic atherosclerosis without aneurysmal
dilation. No enlarged abdominal or pelvic lymph nodes.

Reproductive: Prostatic calcifications. Focus of arterial
enhancement in the central gland on image 70/9. Recommend
correlation with PSA.

Other: No abdominopelvic ascites.

Musculoskeletal: Multilevel degenerative changes spine. No
suspicious lytic or blastic lesion of bone. Degenerative change of
the hips and SI joints.
IMPRESSION: 1. No hydronephrosis. No renal, ureteral, or bladder calculus. No
solid enhancing renal lesion.
2. Mild trabecular bladder wall thickening, which may reflect
sequela of chronic outflow impedance.
3. Focus of arterial enhancement in the central gland of the
prostate gland, nonspecific. Recommend correlation with PSA.
4. Sigmoid colonic diverticulosis without findings of acute
diverticulitis.
5. Aortic atherosclerosis.

Aortic Atherosclerosis ([id number].W).

## 2022-07-26 DIAGNOSIS — J449 Chronic obstructive pulmonary disease, unspecified: Secondary | ICD-10-CM | POA: Diagnosis not present

## 2022-07-26 DIAGNOSIS — R079 Chest pain, unspecified: Secondary | ICD-10-CM | POA: Diagnosis not present

## 2022-07-26 DIAGNOSIS — R69 Illness, unspecified: Secondary | ICD-10-CM | POA: Diagnosis not present

## 2022-07-26 DIAGNOSIS — I340 Nonrheumatic mitral (valve) insufficiency: Secondary | ICD-10-CM | POA: Diagnosis not present

## 2022-07-26 DIAGNOSIS — R0602 Shortness of breath: Secondary | ICD-10-CM | POA: Diagnosis not present

## 2022-08-14 DIAGNOSIS — R079 Chest pain, unspecified: Secondary | ICD-10-CM | POA: Diagnosis not present

## 2022-08-20 DIAGNOSIS — R69 Illness, unspecified: Secondary | ICD-10-CM | POA: Diagnosis not present

## 2022-08-20 DIAGNOSIS — R079 Chest pain, unspecified: Secondary | ICD-10-CM | POA: Diagnosis not present

## 2022-08-20 DIAGNOSIS — I509 Heart failure, unspecified: Secondary | ICD-10-CM | POA: Diagnosis not present

## 2022-08-20 DIAGNOSIS — J449 Chronic obstructive pulmonary disease, unspecified: Secondary | ICD-10-CM | POA: Diagnosis not present

## 2022-08-20 DIAGNOSIS — R0602 Shortness of breath: Secondary | ICD-10-CM | POA: Diagnosis not present

## 2022-08-20 DIAGNOSIS — I340 Nonrheumatic mitral (valve) insufficiency: Secondary | ICD-10-CM | POA: Diagnosis not present

## 2022-08-21 ENCOUNTER — Ambulatory Visit: Payer: Self-pay | Admitting: Cardiovascular Disease

## 2022-08-21 DIAGNOSIS — R69 Illness, unspecified: Secondary | ICD-10-CM | POA: Diagnosis not present

## 2022-08-21 DIAGNOSIS — J449 Chronic obstructive pulmonary disease, unspecified: Secondary | ICD-10-CM | POA: Diagnosis not present

## 2022-08-21 DIAGNOSIS — R079 Chest pain, unspecified: Secondary | ICD-10-CM | POA: Insufficient documentation

## 2022-08-21 DIAGNOSIS — I340 Nonrheumatic mitral (valve) insufficiency: Secondary | ICD-10-CM | POA: Diagnosis not present

## 2022-08-21 DIAGNOSIS — I509 Heart failure, unspecified: Secondary | ICD-10-CM | POA: Diagnosis not present

## 2022-08-21 DIAGNOSIS — R0602 Shortness of breath: Secondary | ICD-10-CM | POA: Diagnosis not present

## 2022-08-21 MED ORDER — SODIUM CHLORIDE 0.9% FLUSH: 3.0000 mL | Freq: Two times a day (BID) | INTRAVENOUS | Status: DC

## 2022-08-24 ENCOUNTER — Ambulatory Visit
Admission: RE | Admit: 2022-08-24 | Discharge: 2022-08-24 | Disposition: A | Discharge status: Home / Self Care | Payer: 59 | Attending: Cardiovascular Disease | Admitting: Cardiovascular Disease

## 2022-08-24 DIAGNOSIS — R079 Chest pain, unspecified: Secondary | ICD-10-CM | POA: Insufficient documentation

## 2022-08-27 ENCOUNTER — Other Ambulatory Visit: Payer: Self-pay

## 2022-08-27 ENCOUNTER — Ambulatory Visit
Admission: RE | Admit: 2022-08-27 | Discharge: 2022-08-27 | Disposition: A | Discharge status: Home / Self Care | Payer: 59 | Source: Ambulatory Visit | Attending: Cardiovascular Disease | Admitting: Cardiovascular Disease

## 2022-08-27 ENCOUNTER — Encounter: Payer: Self-pay | Admitting: Cardiovascular Disease

## 2022-08-27 ENCOUNTER — Encounter
Admission: RE | Disposition: A | Discharge status: Home / Self Care | Payer: Self-pay | Source: Ambulatory Visit | Attending: Cardiovascular Disease

## 2022-08-27 DIAGNOSIS — R079 Chest pain, unspecified: Secondary | ICD-10-CM | POA: Insufficient documentation

## 2022-08-27 DIAGNOSIS — I700 Atherosclerosis of aorta: Secondary | ICD-10-CM | POA: Insufficient documentation

## 2022-08-27 DIAGNOSIS — I2510 Atherosclerotic heart disease of native coronary artery without angina pectoris: Secondary | ICD-10-CM | POA: Insufficient documentation

## 2022-08-27 DIAGNOSIS — R0602 Shortness of breath: Secondary | ICD-10-CM | POA: Diagnosis not present

## 2022-08-27 DIAGNOSIS — F17210 Nicotine dependence, cigarettes, uncomplicated: Secondary | ICD-10-CM | POA: Insufficient documentation

## 2022-08-27 DIAGNOSIS — R69 Illness, unspecified: Secondary | ICD-10-CM | POA: Diagnosis not present

## 2022-08-27 DIAGNOSIS — I509 Heart failure, unspecified: Secondary | ICD-10-CM | POA: Insufficient documentation

## 2022-08-27 DIAGNOSIS — J449 Chronic obstructive pulmonary disease, unspecified: Secondary | ICD-10-CM | POA: Insufficient documentation

## 2022-08-27 DIAGNOSIS — I340 Nonrheumatic mitral (valve) insufficiency: Secondary | ICD-10-CM | POA: Diagnosis not present

## 2022-08-27 HISTORY — PX: LEFT HEART CATH AND CORONARY ANGIOGRAPHY: CATH118249

## 2022-08-27 SURGERY — LEFT HEART CATH AND CORONARY ANGIOGRAPHY
Anesthesia: Moderate Sedation

## 2022-08-27 MED ORDER — SODIUM CHLORIDE 0.9% FLUSH: 3.0000 mL | INTRAVENOUS | Status: DC | PRN

## 2022-08-27 MED ORDER — NITROGLYCERIN 1 MG/10 ML FOR IR/CATH LAB
INTRA_ARTERIAL | Status: DC | PRN
  Administered 2022-08-27: 200 ug via INTRACORONARY

## 2022-08-27 MED ORDER — MIDAZOLAM HCL 2 MG/2ML IJ SOLN
INTRAMUSCULAR | Status: DC | PRN
  Administered 2022-08-27 (×2): 1 mg via INTRAVENOUS

## 2022-08-27 MED ORDER — ASPIRIN 81 MG PO CHEW: 81.0000 mg | CHEWABLE_TABLET | ORAL | Status: DC

## 2022-08-27 MED ORDER — SODIUM CHLORIDE 0.9 % WEIGHT BASED INFUSION: 1.0000 mL/kg/h | INTRAVENOUS | Status: DC

## 2022-08-27 MED ORDER — HEPARIN (PORCINE) IN NACL 1000-0.9 UT/500ML-% IV SOLN
INTRAVENOUS | Status: DC | PRN
  Administered 2022-08-27 (×2): 500 mL

## 2022-08-27 MED ORDER — MIDAZOLAM HCL 2 MG/2ML IJ SOLN
INTRAMUSCULAR | Status: AC
  Filled 2022-08-27: qty 2

## 2022-08-27 MED ORDER — LABETALOL HCL 5 MG/ML IV SOLN: 10.0000 mg | INTRAVENOUS | Status: DC | PRN

## 2022-08-27 MED ORDER — FENTANYL CITRATE (PF) 100 MCG/2ML IJ SOLN
INTRAMUSCULAR | Status: AC
  Filled 2022-08-27: qty 2

## 2022-08-27 MED ORDER — ONDANSETRON HCL 4 MG/2ML IJ SOLN: 4.0000 mg | Freq: Four times a day (QID) | INTRAMUSCULAR | Status: DC | PRN

## 2022-08-27 MED ORDER — SODIUM CHLORIDE 0.9 % WEIGHT BASED INFUSION
3.0000 mL/kg/h | INTRAVENOUS | Status: DC
  Administered 2022-08-27: 3 mL/kg/h via INTRAVENOUS

## 2022-08-27 MED ORDER — SODIUM CHLORIDE 0.9% FLUSH: 3.0000 mL | Freq: Two times a day (BID) | INTRAVENOUS | Status: DC

## 2022-08-27 MED ORDER — SODIUM CHLORIDE 0.9 % IV SOLN: 250.0000 mL | INTRAVENOUS | Status: DC | PRN

## 2022-08-27 MED ORDER — ACETAMINOPHEN 325 MG PO TABS: 650.0000 mg | ORAL_TABLET | ORAL | Status: DC | PRN

## 2022-08-27 MED ORDER — SODIUM CHLORIDE 0.9 % WEIGHT BASED INFUSION: 3.0000 mL/kg/h | INTRAVENOUS | Status: AC

## 2022-08-27 MED ORDER — NITROGLYCERIN 1 MG/10 ML FOR IR/CATH LAB
INTRA_ARTERIAL | Status: AC
  Filled 2022-08-27: qty 10

## 2022-08-27 MED ORDER — FENTANYL CITRATE (PF) 100 MCG/2ML IJ SOLN
INTRAMUSCULAR | Status: DC | PRN
  Administered 2022-08-27 (×2): 25 ug via INTRAVENOUS

## 2022-08-27 MED ORDER — SODIUM CHLORIDE 0.9 % IV SOLN
250.0000 mL | INTRAVENOUS | Status: DC | PRN
Start: 2022-08-27 — End: 2022-08-27

## 2022-08-27 MED ORDER — IOHEXOL 300 MG/ML  SOLN
INTRAMUSCULAR | Status: DC | PRN
  Administered 2022-08-27: 56 mL

## 2022-08-27 MED ORDER — ASPIRIN 81 MG PO CHEW
CHEWABLE_TABLET | ORAL | Status: AC
  Administered 2022-08-27: 81 mg
  Filled 2022-08-27: qty 1

## 2022-08-27 MED ORDER — HYDRALAZINE HCL 20 MG/ML IJ SOLN: 10.0000 mg | INTRAMUSCULAR | Status: DC | PRN

## 2022-08-27 SURGICAL SUPPLY — 10 items
CATH INFINITI 5FR MULTPACK ANG (CATHETERS) IMPLANT
DEVICE CLOSURE MYNXGRIP 5F (Vascular Products) IMPLANT
NDL PERC 18GX7CM (NEEDLE) IMPLANT
NEEDLE PERC 18GX7CM (NEEDLE) ×1 IMPLANT
PACK CARDIAC CATH (CUSTOM PROCEDURE TRAY) ×1 IMPLANT
PROTECTION STATION PRESSURIZED (MISCELLANEOUS) ×1
SET ATX SIMPLICITY (MISCELLANEOUS) IMPLANT
SHEATH AVANTI 5FR X 11CM (SHEATH) IMPLANT
STATION PROTECTION PRESSURIZED (MISCELLANEOUS) IMPLANT
WIRE GUIDERIGHT .035X150 (WIRE) IMPLANT

## 2022-08-28 ENCOUNTER — Encounter: Payer: Self-pay | Admitting: Cardiovascular Disease

## 2022-08-31 DIAGNOSIS — I2510 Atherosclerotic heart disease of native coronary artery without angina pectoris: Secondary | ICD-10-CM | POA: Diagnosis not present

## 2022-08-31 DIAGNOSIS — J449 Chronic obstructive pulmonary disease, unspecified: Secondary | ICD-10-CM | POA: Diagnosis not present

## 2022-08-31 DIAGNOSIS — R69 Illness, unspecified: Secondary | ICD-10-CM | POA: Diagnosis not present

## 2022-08-31 DIAGNOSIS — I340 Nonrheumatic mitral (valve) insufficiency: Secondary | ICD-10-CM | POA: Diagnosis not present

## 2022-09-10 DIAGNOSIS — Z87891 Personal history of nicotine dependence: Secondary | ICD-10-CM | POA: Diagnosis not present

## 2022-09-10 DIAGNOSIS — Z825 Family history of asthma and other chronic lower respiratory diseases: Secondary | ICD-10-CM | POA: Diagnosis not present

## 2022-09-10 DIAGNOSIS — R030 Elevated blood-pressure reading, without diagnosis of hypertension: Secondary | ICD-10-CM | POA: Diagnosis not present

## 2022-09-10 DIAGNOSIS — Z833 Family history of diabetes mellitus: Secondary | ICD-10-CM | POA: Diagnosis not present

## 2022-09-10 DIAGNOSIS — Z8249 Family history of ischemic heart disease and other diseases of the circulatory system: Secondary | ICD-10-CM | POA: Diagnosis not present

## 2022-09-10 DIAGNOSIS — K219 Gastro-esophageal reflux disease without esophagitis: Secondary | ICD-10-CM | POA: Diagnosis not present

## 2022-09-10 DIAGNOSIS — Z809 Family history of malignant neoplasm, unspecified: Secondary | ICD-10-CM | POA: Diagnosis not present

## 2022-09-10 DIAGNOSIS — J4489 Other specified chronic obstructive pulmonary disease: Secondary | ICD-10-CM | POA: Diagnosis not present

## 2022-10-01 ENCOUNTER — Encounter: Payer: Self-pay | Admitting: Cardiovascular Disease

## 2022-10-01 ENCOUNTER — Ambulatory Visit (INDEPENDENT_AMBULATORY_CARE_PROVIDER_SITE_OTHER): Payer: 59 | Admitting: Cardiovascular Disease

## 2022-10-01 VITALS — BP 120/80 | HR 104 | Ht 69.0 in | Wt 129.6 lb

## 2022-10-01 DIAGNOSIS — K2100 Gastro-esophageal reflux disease with esophagitis, without bleeding: Secondary | ICD-10-CM

## 2022-10-01 DIAGNOSIS — I110 Hypertensive heart disease with heart failure: Secondary | ICD-10-CM

## 2022-10-01 DIAGNOSIS — R0789 Other chest pain: Secondary | ICD-10-CM | POA: Diagnosis not present

## 2022-10-01 DIAGNOSIS — I2089 Other forms of angina pectoris: Secondary | ICD-10-CM

## 2022-10-01 DIAGNOSIS — I43 Cardiomyopathy in diseases classified elsewhere: Secondary | ICD-10-CM

## 2022-10-01 MED ORDER — PANTOPRAZOLE SODIUM 40 MG PO TBEC: 40.0000 mg | DELAYED_RELEASE_TABLET | Freq: Every day | ORAL | 1 refills | Status: DC

## 2022-10-01 MED ORDER — ENALAPRIL MALEATE 5 MG PO TABS: 5.0000 mg | ORAL_TABLET | Freq: Every day | ORAL | 11 refills | Status: DC

## 2022-10-01 MED ORDER — METOPROLOL TARTRATE 50 MG PO TABS: 50.0000 mg | ORAL_TABLET | Freq: Every day | ORAL | 11 refills | Status: DC

## 2022-10-01 NOTE — Addendum Note (Signed)
Addended by: Neoma Laming A on: 10/01/2022 03:44 PM   Modules accepted: Orders

## 2022-10-01 NOTE — Progress Notes (Signed)
Cardiology Office Note   Date:  10/01/2022   ID:  [name], DOB 13-Sep-1955, MRN [id number]  PCP:  Mechele Claude, FNP  Cardiologist:  Neoma Laming, MD      History of Present Illness: [name] is a 67 y.o. male who presents for  Chief Complaint  Patient presents with   Follow-up    1 mo fu    Shortness of Breath This is a recurrent problem. The current episode started yesterday. The problem occurs every several days. The problem has been gradually worsening. Associated symptoms include chest pain. The symptoms are aggravated by exercise.  Chest Pain  This is a recurrent problem. The current episode started in the past 7 days. The onset quality is sudden. The problem occurs 2 to 4 times per day. The problem has been waxing and waning. The pain is present in the lateral region. The pain is at a severity of 3/10. The pain is mild. The quality of the pain is described as dull. Associated symptoms include shortness of breath.      Past Medical History:  Diagnosis Date   COPD (chronic obstructive pulmonary disease) ([place])    Hematuria    Ulcer      Past Surgical History:  Procedure Laterality Date   ABDOMINAL SURGERY     "burst blood vessel"   INNER EAR SURGERY     "redid eardrum"   LEFT HEART CATH AND CORONARY ANGIOGRAPHY N/A 08/27/2022   Procedure: LEFT HEART CATH AND CORONARY ANGIOGRAPHY;  Surgeon: Dionisio David, MD;  Location: [facility];  Service: Cardiovascular;  Laterality: N/A;   stomach ulcer surgery       Current Outpatient Medications  Medication Sig Dispense Refill   acetaminophen (TYLENOL) 500 MG tablet Take 500 mg by mouth every 6 (six) hours as needed.     albuterol (PROVENTIL HFA;VENTOLIN HFA) 108 (90 Base) MCG/ACT inhaler Inhale 2 puffs into the lungs every 6 (six) hours as needed for wheezing or shortness of breath. 1 Inhaler 0   pantoprazole (PROTONIX) 40 MG tablet Take 1 tablet (40 mg total) by mouth daily. 30 tablet 1    beclomethasone (QVAR) 40 MCG/ACT inhaler Inhale 1 puff into the lungs 2 (two) times daily. (Patient not taking: Reported on 08/27/2022) 1 Inhaler 0   cephALEXin (KEFLEX) 500 MG capsule Take 1 capsule (500 mg total) by mouth 3 (three) times daily. (Patient not taking: Reported on 08/27/2022) 15 capsule 0   diazepam (VALIUM) 10 MG tablet Take 30 minutes prior to cystoscopy (Patient not taking: Reported on 08/27/2022) 1 tablet 0   TRELEGY ELLIPTA 100-62.5-25 MCG/INH AEPB Inhale 1 puff into the lungs daily. (Patient not taking: Reported on 08/27/2022)     No current facility-administered medications for this visit.   Facility-Administered Medications Ordered in Other Visits  Medication Dose Route Frequency Provider Last Rate Last Admin   sodium chloride flush (NS) 0.9 % injection 3 mL  3 mL Intravenous Q12H Scoggins, Amber, NP        Allergies:   Patient has no known allergies.    Social History:   reports that he has quit smoking. His smoking use included cigarettes. He has a 40.00 pack-year smoking history. He uses smokeless tobacco. He reports current alcohol use. He reports that he does not use drugs.   Family History:  family history includes Cancer in his mother; Diabetes in his brother; Leukemia in his father.    ROS:  Review of Systems  Constitutional: Negative.   HENT: Negative.    Eyes: Negative.   Respiratory:  Positive for shortness of breath.   Cardiovascular:  Positive for chest pain.  Gastrointestinal: Negative.   Genitourinary: Negative.   Musculoskeletal: Negative.   Skin: Negative.   Neurological: Negative.   Endo/Heme/Allergies: Negative.   Psychiatric/Behavioral: Negative.    All other systems reviewed and are negative.     All other systems are reviewed and negative.    PHYSICAL EXAM: VS:  BP 120/80   Pulse (!) 104   Ht '5\' 9"'$  (1.753 m)   Wt 129 lb 9.6 oz (58.8 kg)   SpO2 92%   BMI 19.14 kg/m  , BMI Body mass index is 19.14 kg/m. Last weight:  Wt  Readings from Last 3 Encounters:  10/01/22 129 lb 9.6 oz (58.8 kg)  08/27/22 130 lb (59 kg)  01/19/21 120 lb (54.4 kg)     Physical Exam Vitals reviewed.  Constitutional:      Appearance: Normal appearance. He is normal weight.  HENT:     Head: Normocephalic.     Nose: Nose normal.     Mouth/Throat:     Mouth: Mucous membranes are moist.  Eyes:     Pupils: Pupils are equal, round, and reactive to light.  Cardiovascular:     Rate and Rhythm: Normal rate and regular rhythm.     Pulses: Normal pulses.     Heart sounds: Normal heart sounds.  Pulmonary:     Effort: Pulmonary effort is normal.  Abdominal:     General: Abdomen is flat. Bowel sounds are normal.  Musculoskeletal:        General: Normal range of motion.     Cervical back: Normal range of motion.  Skin:    General: Skin is warm.  Neurological:     General: No focal deficit present.     Mental Status: He is alert.  Psychiatric:        Mood and Affect: Mood normal.       EKG:   TESTS                                                                                          [facility] [address] [place], [place] [address] [phone] STUDY:  Gated Stress / Rest Myocardial Perfusion Imaging Tomographic (SPECT) Including attenuation correction Wall Motion, Left Ventricular Ejection Fraction By Gated Technique.Treadmill Stress Test. SEX: Male  WEIGHT: 125 lbs   HEIGHT: 69 in     ARMS UP: YES/NO                                                                        REFERRING PHYSICIAN: Dr.Dainelle Hun Humphrey Rolls  INDICATION FOR STUDY: CP                                                                                                                                                                                                                     TECHNIQUE:  Approximately 20 minutes following the intravenous administration of 10.2 mCi of Tc-13mSestamibi after stress testing in a reclined supine position with arms above their head if able to do so, gated SPECT imaging of the heart was performed. After about a 2hr break, the patient was injected intravenously with 31.5 mCi of Tc-943mestamibi.  Approximately 45 minutes later in the same position as stress imaging SPECT rest imaging of the heart was performed.  STRESS BY:  ShNeoma LamingMD PROTOCOL:  BrDarnell Level                                                                                     MAX PRED HR: 156                     85%: 133               75%: 117                                                                                                                   RESTING BP: 112/70  RESTING HR: 120  PEAK BP: 164/80  PEAK HR: 134 (89%)  EXERCISE DURATION:  2:14                                            METS: 4.5     REASON FOR TEST TERMINATION: Fatigue. SOB.                                                                                                                                  SYMPTOMS: Fatigue. SOB.  [place] TREADMILL SCORE: 2                                       RISK: Moderate                                                                                                                                                                                                            EKG RESULTS: Sinus tachycardia. 120/min. RBBB. No significant ST changes at peak exercise.                                                             IMAGE QUALITY: Good  PERFUSION/WALL MOTION FINDINGS: EF = 33%. Dilated LV. Large severe fixed apex (17) wall defect, global hypokinesis.                                                                         IMPRESSION: Celine Ahr rule out ischemia in the LAD territory with severe LV dysfunction.                                                                                                                                                                                                                                                                                         Neoma Laming, MD Stress Interpreting Physician / Nuclear Interpreting Physician                         Neoma Laming MD  Electronically signed by: Neoma Laming     Date: 08/23/2022 10:44 REASON FOR VISIT  Visit for: Echocardiogram/R07.9  Sex:   Male         wt= 132   lbs.  BP=130/78  Height=69    inches.        TESTS  Imaging: Echocardiogram:  An echocardiogram in (2-d) mode was performed and in Doppler mode with color flow velocity mapping was performed. The aortic valve cusps are abnormal 2.0 cm, flow velocity .979   m/s, and systolic calculated mean flow gradient 3 mmHg. Mitral valve diastolic peak flow velocity E .41   m/s and E/A ratio 0.6. Aortic root diameter 3.3 cm. The LVOT internal diameter 2.7 cm and flow velocity was abnormal .91  m/s. LV systolic dimension [id number]   cm, diastolic 0000000  cm, posterior wall thickness 1.42  cm, fractional shortening 25.6  %, and EF 50.4 %. IVS thickness 0.944  cm. LA dimension 3.2 cm. Mitral Valve has Trace Regurgitation.     ASSESSMENT  Technically adequate study.  Normal chamber sizes.  Borderline left ventricular systolic function.  Mild left ventricular hypertrophy with GRADE 1 (relaxation abnormality) diastolic dysfunction.  Normal right ventricular systolic function.  Normal right ventricular  diastolic function.  Normal left ventricular wall motion.  Normal right ventricular wall motion.  Normal pulmonary artery pressure.  Trace mitral regurgitation.  No pericardial effusion.  Mild LVH.     THERAPY   Referring physician: Dionisio David  Sonographer: Neoma Laming.      Neoma Laming MD  Electronically signed by: Neoma Laming     Date: 08/14/2022 14:01 Recent Labs: No results found for requested labs within last 365 days.    Lipid Panel No results found for: "CHOL", "TRIG", "HDL", "CHOLHDL", "VLDL", "LDLCALC", "LDLDIRECT"    Other studies Reviewed: Additional studies/ records that were reviewed today include:  Review of the above records demonstrates:       No data to display            ASSESSMENT AND PLAN:    ICD-10-CM   1. Chest pain, non-cardiac  R07.89    had 20% left main disease, rwst of coronaries were normal. But has LV dysfunction with EF 33-50%. Advise treatment GERD    2. Stable angina pectoris  I20.89     3. Gastroesophageal reflux disease with esophagitis without hemorrhage  K21.00    continue protonix    4. Cardiomyopathy due to hypertension, with heart failure (HCC)  I11.0    I43    Will treat this       Problem List Items Addressed This Visit       Other   Chest pain   Other Visit Diagnoses     Chest pain, non-cardiac    -  Primary   had 20% left main disease, rwst of coronaries were normal. But has LV dysfunction with EF 33-50%. Advise treatment GERD   Gastroesophageal reflux disease with esophagitis without hemorrhage       continue protonix   Cardiomyopathy due to hypertension, with heart failure ([place])       Will treat this          Disposition:   Return in about 3 months (around 12/30/2022).    Total time spent: 35 minutes  Signed,  Neoma Laming, MD  10/01/2022 3:40 [place]

## 2022-10-05 ENCOUNTER — Other Ambulatory Visit: Payer: Self-pay

## 2022-10-05 DIAGNOSIS — R0981 Nasal congestion: Secondary | ICD-10-CM

## 2022-10-05 MED ORDER — AZITHROMYCIN 250 MG PO TABS: 250.0000 mg | ORAL_TABLET | Freq: Every day | ORAL | 0 refills | Status: DC

## 2022-10-29 ENCOUNTER — Ambulatory Visit: Payer: 59 | Admitting: Cardiovascular Disease

## 2022-12-02 ENCOUNTER — Other Ambulatory Visit: Payer: Self-pay | Admitting: Family

## 2022-12-30 ENCOUNTER — Other Ambulatory Visit: Payer: Self-pay | Admitting: Cardiovascular Disease

## 2023-02-03 ENCOUNTER — Other Ambulatory Visit: Payer: Self-pay | Admitting: Family

## 2023-04-08 ENCOUNTER — Other Ambulatory Visit: Payer: Self-pay | Admitting: Family

## 2023-05-04 ENCOUNTER — Other Ambulatory Visit: Payer: Self-pay | Admitting: Family

## 2023-06-13 ENCOUNTER — Other Ambulatory Visit: Payer: Self-pay | Admitting: Cardiovascular Disease

## 2023-06-24 ENCOUNTER — Other Ambulatory Visit: Payer: Self-pay | Admitting: Family

## 2023-08-16 ENCOUNTER — Encounter: Payer: Self-pay | Admitting: Family

## 2023-08-16 ENCOUNTER — Ambulatory Visit (INDEPENDENT_AMBULATORY_CARE_PROVIDER_SITE_OTHER): Payer: Medicare Other | Admitting: Family

## 2023-08-16 VITALS — BP 118/78 | HR 99 | Ht 69.0 in | Wt 145.0 lb

## 2023-08-16 DIAGNOSIS — E559 Vitamin D deficiency, unspecified: Secondary | ICD-10-CM

## 2023-08-16 DIAGNOSIS — F17200 Nicotine dependence, unspecified, uncomplicated: Secondary | ICD-10-CM | POA: Diagnosis not present

## 2023-08-16 DIAGNOSIS — Z0000 Encounter for general adult medical examination without abnormal findings: Secondary | ICD-10-CM | POA: Diagnosis not present

## 2023-08-16 DIAGNOSIS — R7303 Prediabetes: Secondary | ICD-10-CM

## 2023-08-16 DIAGNOSIS — R5383 Other fatigue: Secondary | ICD-10-CM

## 2023-08-16 DIAGNOSIS — E782 Mixed hyperlipidemia: Secondary | ICD-10-CM

## 2023-08-16 DIAGNOSIS — E538 Deficiency of other specified B group vitamins: Secondary | ICD-10-CM

## 2023-08-16 DIAGNOSIS — I10 Essential (primary) hypertension: Secondary | ICD-10-CM

## 2023-08-16 DIAGNOSIS — Z1159 Encounter for screening for other viral diseases: Secondary | ICD-10-CM

## 2023-08-16 DIAGNOSIS — Z114 Encounter for screening for human immunodeficiency virus [HIV]: Secondary | ICD-10-CM

## 2023-08-16 NOTE — Patient Instructions (Signed)
 Health Maintenance, Male  Adopting a healthy lifestyle and getting preventive care are important in promoting health and wellness. Ask your health care provider about:  The right schedule for you to have regular tests and exams.  Things you can do on your own to prevent diseases and keep yourself healthy.  What should I know about diet, weight, and exercise?  Eat a healthy diet    Eat a diet that includes plenty of vegetables, fruits, low-fat dairy products, and lean protein.  Do not eat a lot of foods that are high in solid fats, added sugars, or sodium.  Maintain a healthy weight  Body mass index (BMI) is a measurement that can be used to identify possible weight problems. It estimates body fat based on height and weight. Your health care provider can help determine your BMI and help you achieve or maintain a healthy weight.  Get regular exercise  Get regular exercise. This is one of the most important things you can do for your health. Most adults should:  Exercise for at least 150 minutes each week. The exercise should increase your heart rate and make you sweat (moderate-intensity exercise).  Do strengthening exercises at least twice a week. This is in addition to the moderate-intensity exercise.  Spend less time sitting. Even light physical activity can be beneficial.  Watch cholesterol and blood lipids  Have your blood tested for lipids and cholesterol at 66 years of age, then have this test every 5 years.  You may need to have your cholesterol levels checked more often if:  Your lipid or cholesterol levels are high.  You are older than 66 years of age.  You are at high risk for heart disease.  What should I know about cancer screening?  Many types of cancers can be detected early and may often be prevented. Depending on your health history and family history, you may need to have cancer screening at various ages. This may include screening for:  Colorectal cancer.  Prostate cancer.  Skin cancer.  Lung  cancer.  What should I know about heart disease, diabetes, and high blood pressure?  Blood pressure and heart disease  High blood pressure causes heart disease and increases the risk of stroke. This is more likely to develop in people who have high blood pressure readings or are overweight.  Talk with your health care provider about your target blood pressure readings.  Have your blood pressure checked:  Every 3-5 years if you are 24-52 years of age.  Every year if you are 3 years old or older.  If you are between the ages of 60 and 72 and are a current or former smoker, ask your health care provider if you should have a one-time screening for abdominal aortic aneurysm (AAA).  Diabetes  Have regular diabetes screenings. This checks your fasting blood sugar level. Have the screening done:  Once every three years after age 66 if you are at a normal weight and have a low risk for diabetes.  More often and at a younger age if you are overweight or have a high risk for diabetes.  What should I know about preventing infection?  Hepatitis B  If you have a higher risk for hepatitis B, you should be screened for this virus. Talk with your health care provider to find out if you are at risk for hepatitis B infection.  Hepatitis C  Blood testing is recommended for:  Everyone born from [birth year] through 1963.  Anyone  with known risk factors for hepatitis C.  Sexually transmitted infections (STIs)  You should be screened each year for STIs, including gonorrhea and chlamydia, if:  You are sexually active and are younger than 66 years of age.  You are older than 66 years of age and your health care provider tells you that you are at risk for this type of infection.  Your sexual activity has changed since you were last screened, and you are at increased risk for chlamydia or gonorrhea. Ask your health care provider if you are at risk.  Ask your health care provider about whether you are at high risk for HIV. Your health care provider  may recommend a prescription medicine to help prevent HIV infection. If you choose to take medicine to prevent HIV, you should first get tested for HIV. You should then be tested every 3 months for as long as you are taking the medicine.  Follow these instructions at home:  Alcohol use  Do not drink alcohol if your health care provider tells you not to drink.  If you drink alcohol:  Limit how much you have to 0-2 drinks a day.  Know how much alcohol is in your drink. In the [place] one drink equals one 12 oz bottle of beer (355 mL), one 5 oz glass of wine (148 mL), or one 1 oz glass of hard liquor (44 mL).  Lifestyle  Do not use any products that contain nicotine or tobacco. These products include cigarettes, chewing tobacco, and vaping devices, such as e-cigarettes. If you need help quitting, ask your health care provider.  Do not use street drugs.  Do not share needles.  Ask your health care provider for help if you need support or information about quitting drugs.  General instructions  Schedule regular health, dental, and eye exams.  Stay current with your vaccines.  Tell your health care provider if:  You often feel depressed.  You have ever been abused or do not feel safe at home.  Summary  Adopting a healthy lifestyle and getting preventive care are important in promoting health and wellness.  Follow your health care provider's instructions about healthy diet, exercising, and getting tested or screened for diseases.  Follow your health care provider's instructions on monitoring your cholesterol and blood pressure.  This information is not intended to replace advice given to you by your health care provider. Make sure you discuss any questions you have with your health care provider.  Document Revised: 12/12/2020 Document Reviewed: 12/12/2020  Elsevier Patient Education  2024 [employer].

## 2023-08-16 NOTE — Progress Notes (Signed)
 Complete physical exam  Patient: [name]   DOB: 02-Apr-1956   68 y.o. Male  MRN: [id number]  Subjective:    Chief Complaint  Patient presents with   Annual Exam    CPE    [name] is a 68 y.o. male who presents today for a complete physical exam. He reports consuming a general diet. The patient does not participate in regular exercise at present. He generally feels well. He reports sleeping fairly well. He does not have additional problems to discuss today.    Most recent depression screenings:    08/16/2023    2:25 PM  PHQ 2/9 Scores  PHQ - 2 Score 3  PHQ- 9 Score 6    Past Medical History:  Diagnosis Date   COPD (chronic obstructive pulmonary disease) (HCC)    Hematuria    Ulcer     Past Surgical History:  Procedure Laterality Date   ABDOMINAL SURGERY     burst blood vessel   INNER EAR SURGERY     redid eardrum   LEFT HEART CATH AND CORONARY ANGIOGRAPHY N/A 08/27/2022   Procedure: LEFT HEART CATH AND CORONARY ANGIOGRAPHY;  Surgeon: Fernand Denyse LABOR, MD;  Location: [facility] INVASIVE CV LAB;  Service: Cardiovascular;  Laterality: N/A;   stomach ulcer surgery      Family History  Problem Relation Age of Onset   Cancer Mother    Leukemia Father    Diabetes Brother    Kidney cancer Neg Hx    Prostate cancer Neg Hx    Bladder Cancer Neg Hx     Social History   Socioeconomic History   Marital status: Single    Spouse name: Not on file   Number of children: Not on file   Years of education: Not on file   Highest education level: Not on file  Occupational History   Not on file  Tobacco Use   Smoking status: Former    Current packs/day: 1.00    Average packs/day: 1 pack/day for 40.0 years (40.0 ttl pk-yrs)    Types: Cigarettes   Smokeless tobacco: Current   Tobacco comments:    On nicotine  pouch  Substance and Sexual Activity   Alcohol use: Yes    Comment: quit April 12th 2018   Drug use: No   Sexual activity: Not on file  Other Topics Concern    Not on file  Social History Narrative   Not on file   Social Drivers of Health   Financial Resource Strain: Not on file  Food Insecurity: Not on file  Transportation Needs: Not on file  Physical Activity: Not on file  Stress: Not on file  Social Connections: Not on file  Intimate Partner Violence: Not on file    Outpatient Medications Prior to Visit  Medication Sig   acetaminophen  (TYLENOL ) 500 MG tablet Take 500 mg by mouth every 6 (six) hours as needed.   albuterol  (PROVENTIL  HFA;VENTOLIN  HFA) 108 (90 Base) MCG/ACT inhaler Inhale 2 puffs into the lungs every 6 (six) hours as needed for wheezing or shortness of breath.   aspirin  81 MG chewable tablet Chew 81 mg by mouth daily.   TRELEGY ELLIPTA 100-62.5-25 MCG/ACT AEPB INHALE ONE PUFF ONCE DAILY. RINSE MOUTH WELL AFTER USE. - NO MORE REFILLS WITHOUT AN APPOINTMENT   [DISCONTINUED] azithromycin  (ZITHROMAX ) 250 MG tablet Take 1 tablet (250 mg total) by mouth daily. (Patient not taking: Reported on 08/16/2023)   [DISCONTINUED] [facility] ASPIRIN  LOW DOSE 81 MG  tablet TAKE 1 TABLET BY MOUTH EVERY DAY (Patient not taking: Reported on 08/16/2023)   [DISCONTINUED] enalapril  (VASOTEC ) 5 MG tablet Take 1 tablet (5 mg total) by mouth daily. (Patient not taking: Reported on 08/16/2023)   [DISCONTINUED] metoprolol  tartrate (LOPRESSOR ) 50 MG tablet Take 1 tablet (50 mg total) by mouth daily. (Patient not taking: Reported on 08/16/2023)   [DISCONTINUED] pantoprazole  (PROTONIX ) 40 MG tablet Take 1 tablet (40 mg total) by mouth daily. (Patient not taking: Reported on 08/16/2023)   [DISCONTINUED] sodium chloride  flush (NS) 0.9 % injection 3 mL    No facility-administered medications prior to visit.    Review of Systems  Constitutional:  Positive for malaise/fatigue.  All other systems reviewed and are negative.       Objective:     BP 118/78   Pulse 99   Ht 5' 9 (1.753 m)   Wt 145 lb (65.8 kg)   SpO2 93%   BMI 21.41 kg/m    Physical  Exam Vitals and nursing note reviewed.  Constitutional:      Appearance: Normal appearance. He is normal weight.  HENT:     Head: Normocephalic and atraumatic.  Eyes:     Extraocular Movements: Extraocular movements intact.     Conjunctiva/sclera: Conjunctivae normal.     Pupils: Pupils are equal, round, and reactive to light.  Cardiovascular:     Rate and Rhythm: Normal rate and regular rhythm.     Pulses: Normal pulses.     Heart sounds: Normal heart sounds.  Pulmonary:     Effort: Pulmonary effort is normal.     Breath sounds: Normal breath sounds.  Neurological:     General: No focal deficit present.     Mental Status: He is alert.  Psychiatric:        Mood and Affect: Mood normal.        Behavior: Behavior normal.        Thought Content: Thought content normal.        Judgment: Judgment normal.      No results found for any visits on 08/16/23.  No results found for this or any previous visit (from the past 2160 hours).      Assessment & Plan:    Routine Health Maintenance and Physical Exam  Immunization History  Administered Date(s) Administered   Tdap 05/20/2021    Health Maintenance  Topic Date Due   Medicare Annual Wellness (AWV)  Never done   Pneumonia Vaccine 38+ Years old (1 of 2 - PCV) Never done   Hepatitis C Screening  Never done   Colonoscopy  Never done   Zoster Vaccines- Shingrix (1 of 2) Never done   Lung Cancer Screening  07/13/2021   INFLUENZA VACCINE  Never done   COVID-19 Vaccine (1 - 2024-25 season) Never done   DTaP/Tdap/Td (2 - Td or Tdap) 05/21/2031   HIV Screening  Completed   HPV VACCINES  Aged Out    Discussed health benefits of physical activity, and encouraged him to engage in regular exercise appropriate for his age and condition.  Problem List Items Addressed This Visit   None Visit Diagnoses       Routine general medical examination at a health care facility    -  Primary     Current smoker       Ordering CT low-dose  screening today. Patient scheduled for appointment for this. Will call with results   Relevant Orders   CT CHEST LUNG CANCER SCREENING  LOW DOSE WO CONTRAST   CMP14+EGFR   CBC with Diff     B12 deficiency due to diet       Checking labs today.  Will continue supplements as needed.   Relevant Orders   CMP14+EGFR   Vitamin B12   CBC with Diff     Vitamin D  deficiency, unspecified       Checking labs today.  Will continue supplements as needed.   Relevant Orders   VITAMIN D  25 Hydroxy (Vit-D Deficiency, Fractures)   CMP14+EGFR   CBC with Diff     Prediabetes       A1C is in prediabetic ranges. Patient counseled on dietary choices and verbalized understanding. Will reassess at follow up after next lab check.   Relevant Orders   CMP14+EGFR   Hemoglobin A1c   CBC with Diff     Mixed hyperlipidemia       Checking labs today.  Continue current therapy for lipid control. Will modify as needed based on labwork results.   Relevant Medications   aspirin  81 MG chewable tablet   Other Relevant Orders   Lipid panel   CMP14+EGFR   CBC with Diff     Need for hepatitis C screening test       Test ordered in office today. Will call with results.   Relevant Orders   CMP14+EGFR   CBC with Diff   Hepatitis C Ab reflex to Quant PCR     Screening for HIV without presence of risk factors       Test ordered in office today. Will call with results.   Relevant Orders   CMP14+EGFR   CBC with Diff   HIV antibody (with reflex)     Other fatigue       Relevant Orders   CMP14+EGFR   TSH   Iron, TIBC and Ferritin Panel   CBC with Diff   Testosterone ,Free and Total     Essential hypertension, benign       Blood pressure well controlled with current medications.  Continue current therapy.  Will reassess at follow up.   Relevant Medications   aspirin  81 MG chewable tablet   Other Relevant Orders   CMP14+EGFR   CBC with Diff      Return to be determined based on results.      ALAN CHRISTELLA ARRANT, FNP  08/16/2023   This document may have been prepared by [facility] Voice Recognition software and as such may include unintentional dictation errors.

## 2023-08-18 LAB — IRON,TIBC AND FERRITIN PANEL
Ferritin: 95 ng/mL (ref 30–400)
Iron Saturation: 25 % (ref 15–55)
Iron: 76 ug/dL (ref 38–169)
Total Iron Binding Capacity: 308 ug/dL (ref 250–450)
UIBC: 232 ug/dL (ref 111–343)

## 2023-08-18 LAB — HEMOGLOBIN A1C
Est. average glucose Bld gHb Est-mCnc: 123 mg/dL
Hgb A1c MFr Bld: 5.9 % — ABNORMAL HIGH (ref 4.8–5.6)

## 2023-08-18 LAB — CBC WITH DIFFERENTIAL/PLATELET
Basophils Absolute: 0.1 10*3/uL (ref 0.0–0.2)
Basos: 1 %
EOS (ABSOLUTE): 0.7 10*3/uL — ABNORMAL HIGH (ref 0.0–0.4)
Eos: 6 %
Hematocrit: 42.7 % (ref 37.5–51.0)
Hemoglobin: 14.5 g/dL (ref 13.0–17.7)
Immature Grans (Abs): 0 10*3/uL (ref 0.0–0.1)
Immature Granulocytes: 0 %
Lymphocytes Absolute: 3.2 10*3/uL — ABNORMAL HIGH (ref 0.7–3.1)
Lymphs: 28 %
MCH: 33 pg (ref 26.6–33.0)
MCHC: 34 g/dL (ref 31.5–35.7)
MCV: 97 fL (ref 79–97)
Monocytes Absolute: 1.2 10*3/uL — ABNORMAL HIGH (ref 0.1–0.9)
Monocytes: 11 %
Neutrophils Absolute: 5.9 10*3/uL (ref 1.4–7.0)
Neutrophils: 54 %
Platelets: 371 10*3/uL (ref 150–450)
RBC: 4.39 x10E6/uL (ref 4.14–5.80)
RDW: 11.8 % (ref 11.6–15.4)
WBC: 11.1 10*3/uL — ABNORMAL HIGH (ref 3.4–10.8)

## 2023-08-18 LAB — LIPID PANEL
Chol/HDL Ratio: 4.3 {ratio} (ref 0.0–5.0)
Cholesterol, Total: 190 mg/dL (ref 100–199)
HDL: 44 mg/dL (ref >39)
LDL Chol Calc (NIH): 124 mg/dL — ABNORMAL HIGH (ref 0–99)
Triglycerides: 124 mg/dL (ref 0–149)
VLDL Cholesterol Cal: 22 mg/dL (ref 5–40)

## 2023-08-18 LAB — HCV RT-PCR, QUANT (NON-GRAPH)
HCV log10: 6.901 {Log}
Hepatitis C Quantitation: 7970000 [IU]/mL

## 2023-08-18 LAB — CMP14+EGFR
ALT: 21 [IU]/L (ref 0–44)
AST: 22 [IU]/L (ref 0–40)
Albumin: 4.4 g/dL (ref 3.9–4.9)
Alkaline Phosphatase: 83 [IU]/L (ref 44–121)
BUN/Creatinine Ratio: 17 (ref 10–24)
BUN: 16 mg/dL (ref 8–27)
Bilirubin Total: 0.4 mg/dL (ref 0.0–1.2)
CO2: 21 mmol/L (ref 20–29)
Calcium: 10 mg/dL (ref 8.6–10.2)
Chloride: 98 mmol/L (ref 96–106)
Creatinine, Ser: 0.96 mg/dL (ref 0.76–1.27)
Globulin, Total: 2.9 g/dL (ref 1.5–4.5)
Glucose: 98 mg/dL (ref 70–99)
Potassium: 4.3 mmol/L (ref 3.5–5.2)
Sodium: 141 mmol/L (ref 134–144)
Total Protein: 7.3 g/dL (ref 6.0–8.5)
eGFR: 88 mL/min/{1.73_m2} (ref >59)

## 2023-08-18 LAB — VITAMIN D 25 HYDROXY (VIT D DEFICIENCY, FRACTURES): Vit D, 25-Hydroxy: 44.4 ng/mL (ref 30.0–100.0)

## 2023-08-18 LAB — HIV ANTIBODY (ROUTINE TESTING W REFLEX): HIV Screen 4th Generation wRfx: NONREACTIVE

## 2023-08-18 LAB — VITAMIN B12: Vitamin B-12: >2000 pg/mL — ABNORMAL HIGH (ref 232–1245)

## 2023-08-18 LAB — TESTOSTERONE,FREE AND TOTAL
Testosterone, Free: 6.2 pg/mL — ABNORMAL LOW (ref 6.6–18.1)
Testosterone: 477 ng/dL (ref 264–916)

## 2023-08-18 LAB — HCV AB W REFLEX TO QUANT PCR: HCV Ab: REACTIVE — AB

## 2023-08-18 LAB — TSH: TSH: 1.42 u[IU]/mL (ref 0.450–4.500)

## 2023-08-22 ENCOUNTER — Other Ambulatory Visit: Payer: Self-pay | Admitting: Family

## 2023-08-24 ENCOUNTER — Encounter: Payer: Self-pay | Admitting: Family

## 2023-08-24 DIAGNOSIS — B1920 Unspecified viral hepatitis C without hepatic coma: Secondary | ICD-10-CM

## 2023-08-26 NOTE — Progress Notes (Signed)
Informed via Mychart message

## 2023-09-03 ENCOUNTER — Ambulatory Visit: Payer: Medicare Other

## 2023-09-03 DIAGNOSIS — F17210 Nicotine dependence, cigarettes, uncomplicated: Secondary | ICD-10-CM

## 2023-09-03 DIAGNOSIS — F17200 Nicotine dependence, unspecified, uncomplicated: Secondary | ICD-10-CM

## 2023-10-27 ENCOUNTER — Other Ambulatory Visit: Payer: Self-pay | Admitting: Family

## 2023-12-18 ENCOUNTER — Encounter: Payer: Self-pay | Admitting: Family

## 2023-12-18 ENCOUNTER — Ambulatory Visit (INDEPENDENT_AMBULATORY_CARE_PROVIDER_SITE_OTHER): Admitting: Family

## 2023-12-18 VITALS — BP 150/76 | HR 94 | Ht 69.0 in | Wt 140.4 lb

## 2023-12-18 DIAGNOSIS — Z0130 Encounter for examination of blood pressure without abnormal findings: Secondary | ICD-10-CM

## 2023-12-18 DIAGNOSIS — M25511 Pain in right shoulder: Secondary | ICD-10-CM

## 2023-12-18 DIAGNOSIS — G8929 Other chronic pain: Secondary | ICD-10-CM

## 2023-12-18 DIAGNOSIS — Z1211 Encounter for screening for malignant neoplasm of colon: Secondary | ICD-10-CM | POA: Diagnosis not present

## 2023-12-18 MED ORDER — CELECOXIB 100 MG PO CAPS: 100.0000 mg | ORAL_CAPSULE | Freq: Two times a day (BID) | ORAL | 1 refills | Status: DC

## 2023-12-18 NOTE — Patient Instructions (Addendum)
 Appointment with GI: 05/05/2024 [facility], 2pm.

## 2023-12-22 ENCOUNTER — Encounter: Payer: Self-pay | Admitting: Family

## 2023-12-22 DIAGNOSIS — G8929 Other chronic pain: Secondary | ICD-10-CM | POA: Insufficient documentation

## 2023-12-22 NOTE — Progress Notes (Signed)
 Established Patient Office Visit  Subjective:  Patient ID: [name], male    DOB: 06/13/1956  Age: 68 y.o. MRN: [id number]  Chief Complaint  Patient presents with   Follow-up    Patient here today for concerns with his shoulder. He says that he has been having pains in his shoulder, that have been keeping him from lifting his left arm.  These have been happening for a few weeks.   Asks if we can get him set up with ortho.   He has not heard from GI.  Needs order for Cologuard as well.    No other concerns at this time.   Past Medical History:  Diagnosis Date   COPD (chronic obstructive pulmonary disease) (HCC)    Hematuria    Ulcer     Past Surgical History:  Procedure Laterality Date   ABDOMINAL SURGERY     "burst blood vessel"   INNER EAR SURGERY     "redid eardrum"   LEFT HEART CATH AND CORONARY ANGIOGRAPHY N/A 08/27/2022   Procedure: LEFT HEART CATH AND CORONARY ANGIOGRAPHY;  Surgeon: Cherrie Cornwall, MD;  Location: [facility] INVASIVE CV LAB;  Service: Cardiovascular;  Laterality: N/A;   stomach ulcer surgery      Social History   Socioeconomic History   Marital status: Single    Spouse name: Not on file   Number of children: Not on file   Years of education: Not on file   Highest education level: Not on file  Occupational History   Not on file  Tobacco Use   Smoking status: Former    Current packs/day: 1.00    Average packs/day: 1 pack/day for 40.0 years (40.0 ttl pk-yrs)    Types: Cigarettes   Smokeless tobacco: Current   Tobacco comments:    On nicotine  pouch  Substance and Sexual Activity   Alcohol use: Yes    Comment: quit April 12th 2018   Drug use: No   Sexual activity: Not on file  Other Topics Concern   Not on file  Social History Narrative   Not on file   Social Drivers of Health   Financial Resource Strain: Not on file  Food Insecurity: Not on file  Transportation Needs: Not on file  Physical Activity: Not on file  Stress: Not on  file  Social Connections: Not on file  Intimate Partner Violence: Not on file    Family History  Problem Relation Age of Onset   Cancer Mother    Leukemia Father    Diabetes Brother    Kidney cancer Neg Hx    Prostate cancer Neg Hx    Bladder Cancer Neg Hx     No Known Allergies  Review of Systems  Musculoskeletal:  Positive for joint pain.  All other systems reviewed and are negative.      Objective:   BP (!) 150/76   Pulse 94   Ht 5\' 9"  (1.753 m)   Wt 140 lb 6.4 oz (63.7 kg)   SpO2 92%   BMI 20.73 kg/m   Vitals:   12/18/23 1503  BP: (!) 150/76  Pulse: 94  Height: 5\' 9"  (1.753 m)  Weight: 140 lb 6.4 oz (63.7 kg)  SpO2: 92%  BMI (Calculated): 20.72    Physical Exam Vitals and nursing note reviewed.  Constitutional:      Appearance: Normal appearance. He is normal weight.  Eyes:     Pupils: Pupils are equal, round, and reactive to light.  Cardiovascular:     Rate and Rhythm: Normal rate and regular rhythm.     Pulses: Normal pulses.     Heart sounds: Normal heart sounds.  Pulmonary:     Effort: Pulmonary effort is normal.     Breath sounds: Normal breath sounds.  Musculoskeletal:     Right shoulder: Tenderness present. Decreased range of motion. Decreased strength.  Neurological:     Mental Status: He is alert.  Psychiatric:        Mood and Affect: Mood normal.        Behavior: Behavior normal.      No results found for any visits on 12/18/23.  No results found for this or any previous visit (from the past 2160 hours).     Assessment & Plan:   Problem List Items Addressed This Visit       Other   Chronic right shoulder pain   Relevant Medications   celecoxib  (CELEBREX ) 100 MG capsule   Other Relevant Orders   Ambulatory referral to Orthopedic Surgery   Other Visit Diagnoses       Screening for colon cancer    -  Primary   sending order for cologuard.  Will call with results when available.   Relevant Orders   Cologuard        Return in about 3 months (around 03/19/2024).   Total time spent: 20 minutes  Trenda Frisk, FNP  12/18/2023   This document may have been prepared by [facility] Voice Recognition software and as such may include unintentional dictation errors.

## 2023-12-24 ENCOUNTER — Other Ambulatory Visit: Payer: Self-pay | Admitting: Family

## 2024-01-01 LAB — COLOGUARD: COLOGUARD: NEGATIVE

## 2024-01-02 ENCOUNTER — Ambulatory Visit: Payer: Self-pay

## 2024-02-21 ENCOUNTER — Other Ambulatory Visit: Payer: Self-pay | Admitting: Family

## 2024-03-07 ENCOUNTER — Other Ambulatory Visit: Payer: Self-pay | Admitting: Family

## 2024-03-25 ENCOUNTER — Ambulatory Visit: Admitting: Family

## 2024-03-28 ENCOUNTER — Other Ambulatory Visit: Payer: Self-pay | Admitting: Cardiovascular Disease

## 2024-04-08 ENCOUNTER — Ambulatory Visit: Admitting: Family

## 2024-05-21 ENCOUNTER — Other Ambulatory Visit: Payer: Self-pay | Admitting: Family

## 2024-07-29 ENCOUNTER — Other Ambulatory Visit: Payer: Self-pay | Admitting: Family
